# Patient Record
Sex: Male | Born: 1995 | Race: White | Hispanic: No | Marital: Single | Smoking: Current every day smoker
Health system: Southern US, Community
[De-identification: ages and names within clinical notes are randomized; demographics above are authoritative.]

## PROBLEM LIST (undated history)

## (undated) DIAGNOSIS — F329 Major depressive disorder, single episode, unspecified: Secondary | ICD-10-CM

## (undated) DIAGNOSIS — F32A Depression, unspecified: Secondary | ICD-10-CM

---
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
## 2014-09-10 ENCOUNTER — Emergency Department (HOSPITAL_COMMUNITY)
Admission: EM | Admit: 2014-09-10 | Discharge: 2014-09-11 | Disposition: A | Discharge status: Other Acute Inpatient Hospital | Payer: Medicaid Other | Attending: Emergency Medicine | Admitting: Emergency Medicine

## 2014-09-10 ENCOUNTER — Encounter (HOSPITAL_COMMUNITY): Payer: Self-pay | Admitting: Emergency Medicine

## 2014-09-10 DIAGNOSIS — R440 Auditory hallucinations: Secondary | ICD-10-CM | POA: Diagnosis not present

## 2014-09-10 LAB — CBC WITH DIFFERENTIAL/PLATELET
Basophils Absolute: 0 10*3/uL (ref 0.0–0.1)
Basophils Relative: 0 % (ref 0–1)
EOS ABS: 0.4 10*3/uL (ref 0.0–0.7)
EOS PCT: 3 % (ref 0–5)
HCT: 43.7 % (ref 39.0–52.0)
Hemoglobin: 14.8 g/dL (ref 13.0–17.0)
LYMPHS ABS: 2.9 10*3/uL (ref 0.7–4.0)
Lymphocytes Relative: 25 % (ref 12–46)
MCH: 28.6 pg (ref 26.0–34.0)
MCHC: 33.9 g/dL (ref 30.0–36.0)
MCV: 84.5 fL (ref 78.0–100.0)
MONOS PCT: 5 % (ref 3–12)
Monocytes Absolute: 0.6 10*3/uL (ref 0.1–1.0)
Neutro Abs: 7.7 10*3/uL (ref 1.7–7.7)
Neutrophils Relative %: 67 % (ref 43–77)
PLATELETS: 300 10*3/uL (ref 150–400)
RBC: 5.17 MIL/uL (ref 4.22–5.81)
RDW: 12.7 % (ref 11.5–15.5)
WBC: 11.6 10*3/uL — ABNORMAL HIGH (ref 4.0–10.5)

## 2014-09-10 LAB — COMPREHENSIVE METABOLIC PANEL
ALT: 35 U/L (ref 17–63)
ANION GAP: 10 (ref 5–15)
AST: 39 U/L (ref 15–41)
Albumin: 5.2 g/dL — ABNORMAL HIGH (ref 3.5–5.0)
Alkaline Phosphatase: 88 U/L (ref 38–126)
BUN: 12 mg/dL (ref 6–20)
CALCIUM: 10 mg/dL (ref 8.9–10.3)
CHLORIDE: 105 mmol/L (ref 101–111)
CO2: 26 mmol/L (ref 22–32)
Creatinine, Ser: 0.81 mg/dL (ref 0.61–1.24)
GFR calc non Af Amer: >60 mL/min (ref >60)
Glucose, Bld: 102 mg/dL — ABNORMAL HIGH (ref 65–99)
Potassium: 4.4 mmol/L (ref 3.5–5.1)
SODIUM: 141 mmol/L (ref 135–145)
Total Bilirubin: 0.6 mg/dL (ref 0.3–1.2)
Total Protein: 8.7 g/dL — ABNORMAL HIGH (ref 6.5–8.1)

## 2014-09-10 LAB — ETHANOL: Alcohol, Ethyl (B): <5 mg/dL (ref <5)

## 2014-09-10 MED ORDER — ACETAMINOPHEN 325 MG PO TABS: 650.0000 mg | ORAL_TABLET | ORAL | Status: DC | PRN

## 2014-09-10 MED ORDER — IBUPROFEN 200 MG PO TABS: 600.0000 mg | ORAL_TABLET | Freq: Three times a day (TID) | ORAL | Status: DC | PRN

## 2014-09-10 MED ORDER — ZOLPIDEM TARTRATE 5 MG PO TABS
5.0000 mg | ORAL_TABLET | Freq: Every evening | ORAL | Status: DC | PRN
  Administered 2014-09-10: 5 mg via ORAL
  Filled 2014-09-10: qty 1

## 2014-09-10 MED ORDER — ONDANSETRON HCL 4 MG PO TABS: 4.0000 mg | ORAL_TABLET | Freq: Three times a day (TID) | ORAL | Status: DC | PRN

## 2014-09-10 MED ORDER — ALUM & MAG HYDROXIDE-SIMETH 200-200-20 MG/5ML PO SUSP: 30.0000 mL | ORAL | Status: DC | PRN

## 2014-09-10 MED ORDER — LORAZEPAM 1 MG PO TABS: 1.0000 mg | ORAL_TABLET | Freq: Three times a day (TID) | ORAL | Status: DC | PRN

## 2014-09-10 NOTE — ED Provider Notes (Signed)
CSN: [id number]     Arrival date & time 09/10/14  1807 History   First MD Initiated Contact with Patient 09/10/14 1821     Chief Complaint  Patient presents with  . auditory hallucinations      (Consider location/radiation/quality/duration/timing/severity/associated sxs/prior Treatment) HPI Comments: Patient presents to the emergency department for evaluation of auditory hallucinations. Patient reports that he has been hearing voices for the last 4 years. He is not currently on any medications. Patient reports that the voices to command him to hurt himself and others. Patient reports that these have increasingly worsened recently. He has had dreams where he kills people and then killed himself, is concerned that he is going to act on these hallucinations.   History reviewed. No pertinent past medical history. History reviewed. No pertinent past surgical history. No family history on file. Social History  Substance Use Topics  . Smoking status: Never Smoker   . Smokeless tobacco: None  . Alcohol Use: No    Review of Systems  Psychiatric/Behavioral: Positive for hallucinations.  All other systems reviewed and are negative.     Allergies  Review of patient's allergies indicates no known allergies.  Home Medications   Prior to Admission medications   Not on File   There were no vitals taken for this visit. Physical Exam  Constitutional: He is oriented to person, place, and time. He appears well-developed and well-nourished. No distress.  HENT:  Head: Normocephalic and atraumatic.  Right Ear: Hearing normal.  Left Ear: Hearing normal.  Nose: Nose normal.  Mouth/Throat: Oropharynx is clear and moist and mucous membranes are normal.  Eyes: Conjunctivae and EOM are normal. Pupils are equal, round, and reactive to light.  Neck: Normal range of motion. Neck supple.  Cardiovascular: Regular rhythm, S1 normal and S2 normal.  Exam reveals no gallop and no friction rub.   No  murmur heard. Pulmonary/Chest: Effort normal and breath sounds normal. No respiratory distress. He exhibits no tenderness.  Abdominal: Soft. Normal appearance and bowel sounds are normal. There is no hepatosplenomegaly. There is no tenderness. There is no rebound, no guarding, no tenderness at McBurney's point and negative Murphy's sign. No hernia.  Musculoskeletal: Normal range of motion.  Neurological: He is alert and oriented to person, place, and time. He has normal strength. No cranial nerve deficit or sensory deficit. Coordination normal. GCS eye subscore is 4. GCS verbal subscore is 5. GCS motor subscore is 6.  Skin: Skin is warm, dry and intact. No rash noted. No cyanosis.  Psychiatric: His behavior is normal. His speech is delayed. He exhibits a depressed mood.  Nursing note and vitals reviewed.   ED Course  Procedures (including critical care time) Labs Review Labs Reviewed  CBC WITH DIFFERENTIAL/PLATELET  COMPREHENSIVE METABOLIC PANEL  URINE RAPID DRUG SCREEN, HOSP PERFORMED  ETHANOL  URINALYSIS, ROUTINE W REFLEX MICROSCOPIC (NOT AT [facility])    Imaging Review No results found. I have personally reviewed and evaluated these images and lab results as part of my medical decision-making.   EKG Interpretation None      MDM   Final diagnoses:  None   auditory hallucinations  Patient presents for evaluation of auditory hallucinations that command him to kill others and kill himself. He reports that the voices are becoming more prevalent and he is concerned that he is going to act on them. He does not have a plan for homicidal or suicidal. He will require psychiatric evaluation.    Gilda Crease, MD 09/10/14  1837 

## 2014-09-10 NOTE — BH Assessment (Addendum)
Tele Assessment Note   Ronald Gray is an 19 y.o. male presenting to [facility] reporting command auditory hallucinations. Pt stated "for the past 4 years I have been hearing voices". "Sometimes it's a whisper and other times it's like a regular conversation telling mem to hurt myself and others". "I have dreams where I hurt myself and others". Pt is endorsing SI and HI but denies having an active plan. Pt is also reporting auditory hallucinations and stated "I hear the voices saying I'm nothing, I'm worthless and I should take my pain out on others".  Pt reported that he always has thoughts of hurting himself due to the voices telling him to harm himself. Pt did not report any previous suicide attempts or psychiatric hospitalizations. Pt is currently not receiving any mental health treatment. PT denies engaging in any self-injurious behaviors. Pt is endorsing multiple depressive symptoms and shared that finding a job is stressful. Pt reported that there are times he has trouble falling asleep but shared that he gets at least 7-8 hours of sleep. Pt did not report any issues with his appetite. Pt denies having access to weapons or firearms at this time. PT did not report any pending criminal charges or upcoming court dates. PT reported that he smoke marijuana; however he did not provide any additional details regarding his use. Pt did not report any physical, sexual or emotional abuse at this time.  Inpatient treatment is recommended for psychiatric stabilization.   Axis I: Psychotic Disorder NOS  Past Medical History: History reviewed. No pertinent past medical history.  History reviewed. No pertinent past surgical history.  Family History: No family history on file.  Social History:  reports that he has never smoked. He does not have any smokeless tobacco history on file. He reports that he does not drink alcohol or use illicit drugs.  Additional Social History:  Alcohol / Drug Use History of  alcohol / drug use?: No history of alcohol / drug abuse  CIWA:   COWS:    PATIENT STRENGTHS: (choose at least two) Average or above average intelligence Motivation for treatment/growth  Allergies:  Allergies  Allergen Reactions  . Latex Rash    Home Medications:  (Not in a hospital admission)  OB/GYN Status:  No LMP for male patient.  General Assessment Data Location of Assessment: [facility] ED TTS Assessment: In system Is this a Tele or Face-to-Face Assessment?: Face-to-Face Is this an Initial Assessment or a Re-assessment for this encounter?: Initial Assessment Marital status: Single Living Arrangements: Parent, Other relatives Can pt return to current living arrangement?: Yes Admission Status: Voluntary Is patient capable of signing voluntary admission?: Yes Referral Source: Self/Family/Friend Insurance type: Medicaid      Crisis Care Plan Living Arrangements: Parent, Other relatives Name of Psychiatrist: No provider reported at this time.  Name of Therapist: No provider reported at this time.   Education Status Is patient currently in school?: No Current Grade: N/A Highest grade of school patient has completed: N/A Name of school: N/A Contact person: N/A  Risk to self with the past 6 months Suicidal Ideation: Yes-Currently Present Has patient been a risk to self within the past 6 months prior to admission? : Yes Suicidal Intent: No Has patient had any suicidal intent within the past 6 months prior to admission? : No Is patient at risk for suicide?: Yes Suicidal Plan?: No Has patient had any suicidal plan within the past 6 months prior to admission? : No Access to Means: No What  has been your use of drugs/alcohol within the last 12 months?: THC use reported; however pt did not provide any additional details. Previous Attempts/Gestures: No How many times?: 0 Other Self Harm Risks: No other self harm risk identified at this time.  Triggers for Past Attempts: None  known Intentional Self Injurious Behavior: None Family Suicide History: No Recent stressful life event(s): Other (Comment) (unemployment ) Persecutory voices/beliefs?: Yes Depression: Yes Depression Symptoms: Despondent, Insomnia, Isolating, Feeling worthless/self pity, Feeling angry/irritable Substance abuse history and/or treatment for substance abuse?: Yes Suicide prevention information given to non-admitted patients: Not applicable  Risk to Others within the past 6 months Homicidal Ideation: Yes-Currently Present Does patient have any lifetime risk of violence toward others beyond the six months prior to admission? : No Thoughts of Harm to Others: Yes-Currently Present Comment - Thoughts of Harm to Others: "I hear voices telling me to harm others" Current Homicidal Intent: No Access to Homicidal Means: No Identified Victim: N/A History of harm to others?: No Assessment of Violence: On admission Violent Behavior Description: No violent behaviors observed. Pt is calm and cooperative at this time.  Does patient have access to weapons?: No Criminal Charges Pending?: No Does patient have a court date: No Is patient on probation?: No  Psychosis Hallucinations: Auditory, With command Delusions: None noted  Mental Status Report Appearance/Hygiene: Unremarkable Eye Contact: Good Motor Activity: Freedom of movement Speech: Logical/coherent, Soft Level of Consciousness: Alert Mood: Euthymic, Pleasant Affect: Appropriate to circumstance Anxiety Level: Minimal Thought Processes: Coherent, Relevant Judgement: Partial Orientation: Person, Time, Situation, Place Obsessive Compulsive Thoughts/Behaviors: None  Cognitive Functioning Concentration: Fair Memory: Recent Intact, Remote Intact IQ: Average Insight: Fair Impulse Control: Fair Appetite: Good Weight Loss: 0 Weight Gain: 0 Sleep: No Change Total Hours of Sleep: 8 Vegetative Symptoms: Staying in bed ("Lounging around"  )  ADLScreening [facility] Assessment Services) Patient's cognitive ability adequate to safely complete daily activities?: Yes Patient able to express need for assistance with ADLs?: Yes Independently performs ADLs?: Yes (appropriate for developmental age)  Prior Inpatient Therapy Prior Inpatient Therapy: No Prior Therapy Dates: N/A Prior Therapy Facilty/Provider(s): N/A Reason for Treatment: N/A  Prior Outpatient Therapy Prior Outpatient Therapy: No Prior Therapy Dates: N/A Prior Therapy Facilty/Provider(s): N/A Reason for Treatment: N/A Does patient have an ACCT team?: No Does patient have Intensive In-House Services?  : No Does patient have [facility] services? : No Does patient have P4CC services?: No  ADL Screening (condition at time of admission) Patient's cognitive ability adequate to safely complete daily activities?: Yes Is the patient deaf or have difficulty hearing?: No Does the patient have difficulty seeing, even when wearing glasses/contacts?: No Does the patient have difficulty concentrating, remembering, or making decisions?: No Patient able to express need for assistance with ADLs?: Yes Does the patient have difficulty dressing or bathing?: No Independently performs ADLs?: Yes (appropriate for developmental age)       Abuse/Neglect Assessment (Assessment to be complete while patient is alone) Physical Abuse: Denies Verbal Abuse: Denies Sexual Abuse: Denies Exploitation of patient/patient's resources: Denies Self-Neglect: Denies     Merchant navy officer (For Healthcare) Does patient have an advance directive?: No Would patient like information on creating an advanced directive?: No - patient declined information    Additional Information 1:1 In Past 12 Months?: No CIRT Risk: No Elopement Risk: No Does patient have medical clearance?: Yes     Disposition:  Disposition Initial Assessment Completed for this Encounter: Yes  Breunna Nordmann S 09/10/2014 8:12  PM

## 2014-09-10 NOTE — BH Assessment (Signed)
Pt has been accepted [facility] Room 505 Bed 2 to the services of Dr. Elna Breslow. Dr. Blinda Leatherwood has been informed of the recommendation. Support paperwork completed and faxed over.

## 2014-09-10 NOTE — ED Notes (Signed)
Per pt, states he has being hearing voices for 4 years-states they have been getting increasingly worse-unable to concentrate-telling him to hurt self

## 2014-09-10 NOTE — BH Assessment (Signed)
Assessment completed. Consulted Hulan Fess, NP who recommend inpatient treatment. Dr. Blinda Leatherwood has been informed of the recommendation. TTS will seek placement.

## 2014-09-11 ENCOUNTER — Inpatient Hospital Stay (HOSPITAL_COMMUNITY)
Admission: AD | Admit: 2014-09-11 | Discharge: 2014-09-14 | DRG: 885 | Disposition: A | Discharge status: Home / Self Care | Payer: Medicaid Other | Source: Intra-hospital | Attending: Psychiatry | Admitting: Psychiatry

## 2014-09-11 ENCOUNTER — Encounter (HOSPITAL_COMMUNITY): Payer: Self-pay

## 2014-09-11 DIAGNOSIS — F419 Anxiety disorder, unspecified: Secondary | ICD-10-CM | POA: Diagnosis present

## 2014-09-11 DIAGNOSIS — G4710 Hypersomnia, unspecified: Secondary | ICD-10-CM | POA: Diagnosis present

## 2014-09-11 DIAGNOSIS — R45851 Suicidal ideations: Secondary | ICD-10-CM | POA: Diagnosis present

## 2014-09-11 DIAGNOSIS — F1220 Cannabis dependence, uncomplicated: Secondary | ICD-10-CM | POA: Diagnosis present

## 2014-09-11 DIAGNOSIS — F323 Major depressive disorder, single episode, severe with psychotic features: Secondary | ICD-10-CM | POA: Diagnosis present

## 2014-09-11 DIAGNOSIS — F29 Unspecified psychosis not due to a substance or known physiological condition: Secondary | ICD-10-CM | POA: Diagnosis present

## 2014-09-11 HISTORY — DX: Major depressive disorder, single episode, unspecified: F32.9

## 2014-09-11 HISTORY — DX: Depression, unspecified: F32.A

## 2014-09-11 MED ORDER — ACETAMINOPHEN 325 MG PO TABS
650.0000 mg | ORAL_TABLET | Freq: Four times a day (QID) | ORAL | Status: DC | PRN
  Administered 2014-09-11 – 2014-09-14 (×2): 650 mg via ORAL
  Filled 2014-09-11 (×2): qty 2

## 2014-09-11 MED ORDER — HYDROXYZINE HCL 25 MG PO TABS: 25.0000 mg | ORAL_TABLET | Freq: Four times a day (QID) | ORAL | Status: DC | PRN

## 2014-09-11 MED ORDER — ALUM & MAG HYDROXIDE-SIMETH 200-200-20 MG/5ML PO SUSP: 30.0000 mL | ORAL | Status: DC | PRN

## 2014-09-11 MED ORDER — MAGNESIUM HYDROXIDE 400 MG/5ML PO SUSP: 30.0000 mL | Freq: Every day | ORAL | Status: DC | PRN

## 2014-09-11 MED ORDER — TRAZODONE HCL 50 MG PO TABS: 50.0000 mg | ORAL_TABLET | Freq: Every evening | ORAL | Status: DC | PRN

## 2014-09-11 MED ORDER — ARIPIPRAZOLE 2 MG PO TABS
2.0000 mg | ORAL_TABLET | Freq: Every day | ORAL | Status: DC
  Administered 2014-09-11 – 2014-09-14 (×4): 2 mg via ORAL
  Filled 2014-09-11 (×7): qty 1

## 2014-09-11 MED ORDER — OLANZAPINE 5 MG PO TBDP: 5.0000 mg | ORAL_TABLET | Freq: Three times a day (TID) | ORAL | Status: DC | PRN

## 2014-09-11 MED ORDER — INFLUENZA VAC SPLIT QUAD 0.5 ML IM SUSY
0.5000 mL | PREFILLED_SYRINGE | INTRAMUSCULAR | Status: AC
  Administered 2014-09-12: 0.5 mL via INTRAMUSCULAR
  Filled 2014-09-11: qty 0.5

## 2014-09-11 NOTE — Progress Notes (Signed)
[facility] Group Notes:  (Nursing/MHT/Case Management/Adjunct)  Date:  09/11/2014  Time:  11:10 PM  Type of Therapy:  Psychoeducational Skills  Participation Level:  Active  Participation Quality:  Appropriate  Affect:  Appropriate  Cognitive:  Appropriate  Insight:  Good  Engagement in Group:  Developing/Improving  Modes of Intervention:  Education  Summary of Progress/Problems: The patient shared with the group that he had a good day since he was able to play basketball and take a few naps. As a theme for the day, his support system will be comprised of his mother.   Abrea Henle S 09/11/2014, 11:10 PM

## 2014-09-11 NOTE — [facility] Group Notes (Signed)
[facility] Group Notes:  (Clinical Social Work)  09/11/2014  11:15-12:00PM  Summary of Progress/Problems:   The main focus of today's process group was to discuss patients' feelings related to being hospitalized, as well as the difference between "being" and "having" a mental health diagnosis.  It was agreed in general by the group that it would be preferable to avoid future hospitalizations, and we discussed means of doing that.  As a follow-up, problems with adhering to medication recommendations were discussed.  The patient expressed his primary feeling about being hospitalized is "unknown" and was only able to say he feels different in the hospital than out of it, was not able to elaborate.  He did not speak for the entire remainder of group.  Type of Therapy:  Group Therapy - Process  Participation Level:  Active  Participation Quality:  Attentive  Affect:  Flat  Cognitive:  Alert  Insight:  Limited  Engagement in Therapy:  Limited  Modes of Intervention:  Exploration, Discussion  Ronald Mantle, LCSW 09/11/2014, 12:50 PM

## 2014-09-11 NOTE — [facility] Suicide Risk Assessment (Signed)
[facility] INPATIENT:  Family/Significant Other Suicide Prevention Education  Suicide Prevention Education:  Patient Refusal for Family/Significant Other Suicide Prevention Education: The patient Ronald Gray has refused to provide written consent for family/significant other to be provided Family/Significant Other Suicide Prevention Education during admission and/or prior to discharge.  Physician notified.  Patient denies having access to weapons.  Sarina Ser 09/11/2014, 2:06 PM

## 2014-09-11 NOTE — Progress Notes (Signed)
D: Pt has anxious affect and pleasant mood.  He reports he is "doing good."  Pt reports that today he "got up and went to the 2 group sessions, worked on a puzzle, watched TV, went to the gym and I played some basketball."  Pt denies SI/HI, denies hallucinations.  He reports pain from a headache of 5/10.  Pt has been visible in milieu interacting with peers and staff appropriately.  He attended evening group.   A: Introduced self to pt.  Met with pt 1:1 and provided support and encouragement.  Actively listened to pt.  PRN medication administered for pain. R: Pt is compliant with medications.  Pt verbally contracts for safety.  Will continue to monitor and assess.

## 2014-09-11 NOTE — Progress Notes (Signed)
Adult Psychoeducational Group Note  Date:  09/11/2014 Time: 09:15am  Group Topic/Focus:  Healthy Communication:   The focus of this group is to discuss communication, barriers to communication, as well as healthy ways to communicate with others.  Participation Level:  Active  Participation Quality:  Appropriate, Attentive and Sharing  Affect:  Flat  Cognitive:  Alert, Appropriate and Oriented  Insight: Good  Engagement in Group:  Engaged  Modes of Intervention:  Activity, Discussion, Education, Orientation and Socialization  Additional Comments:  Pt actively participated in group. Pt declined to share personal healthy communication technique but did share biggest fears (death and not being loved) during activity.   Aurora Mask 09/11/2014, 10:58 AM

## 2014-09-11 NOTE — Progress Notes (Signed)
Patient ID: Ronald Gray, male   DOB: 07-28-1995, 19 y.o.   MRN: [id number]   D: Pt has been very flat and depressed on the unit today. He has also been very isolative, and has remained in his room most of the day. Pt reported that he was fine, and had no issues. Pt was very guarded and did not share much with this Clinical research associate. Pt had no scheduled medications, and did not request any additional medications. Pt reported being negative SI/HI, no AH/VH noted. A: 15 min checks continued for patient safety. R: Pt safety maintained.

## 2014-09-11 NOTE — H&P (Signed)
Psychiatric Admission Assessment Adult  Patient Identification: Ronald Gray MRN:  [id number] Date of Evaluation:  09/11/2014 Chief Complaint:  "The voices have been getting worse lately."  Principal Diagnosis: Severe major depression with psychotic features, mood-congruent Diagnosis:   Patient Active Problem List   Diagnosis Date Noted  . 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 Severe major depression with psychotic features, mood-congruent [F32.3] 09/11/2014   History of Present Illness::   Ronald Gray is an 19 y.o. male presenting to [facility] reporting command auditory hallucinations. Patient stated "for the past four years I have been hearing voices". "Sometimes it's a whisper and other times it's like a regular conversation telling me to hurt myself and others". "I have dreams where I hurt myself and others". Patient is endorsing SI and HI but denies having an active plan. Patient is also reporting auditory hallucinations and stated "I hear the voices saying I'm nothing, I'm worthless and I should take my pain out on others". Patient reported that he always has thoughts of hurting himself due to the voices telling him to harm himself. Patient did not report any previous suicide attempts or psychiatric hospitalizations. Patient is currently not receiving any mental health treatment. Patient denies engaging in any self-injurious behaviors. Patient is endorsing multiple depressive symptoms and shared that finding a job is stressful. Patient reported that there are times he has trouble falling asleep but shared that he gets at least 7-8 hours of sleep. Patient did not report any issues with his appetite. Patient did not report any pending criminal charges or upcoming court dates. Patient reported that he smokes marijuana; however he did not provide any additional details regarding his use. Patient did not report any physical, sexual or emotional abuse. He denies past manic  episodes, paranoia, delusions, visual hallucinations. He endorses that his main problems are with depression and psychosis. Patient reports "isolating in my room at least once every week. At times he reports not sleeping very much and then too much. He denies every intentionally hurting himself in the past. The patient has a urine drug screen that is pending.   Elements:  Location:  psychosis. Quality:  auditory hallucinations, depressive symptoms. Severity:  Severe. Timing:  Last few weeks. Duration:  "Started four years ago." . Context:  depressive symptoms, command hallucinations . Associated Signs/Symptoms: Depression Symptoms:  depressed mood, anhedonia, hypersomnia, psychomotor retardation, feelings of worthlessness/guilt, difficulty concentrating, hopelessness, recurrent thoughts of death, anxiety, loss of energy/fatigue, (Hypo) Manic Symptoms:  Denies Anxiety Symptoms:  Denies Psychotic Symptoms:  Hallucinations: Auditory PTSD Symptoms: Negative Total Time spent with patient: 45 minutes  Past Medical History: History reviewed. No pertinent past medical history. History reviewed. No pertinent past surgical history. Family History: History reviewed. No pertinent family history. Social History:  History  Alcohol Use No     History  Drug Use No    Social History   Social History  . Marital Status: Single    Spouse Name: N/A  . Number of Children: N/A  . Years of Education: N/A   Social History Main Topics  . Smoking status: Never Smoker   . Smokeless tobacco: None  . Alcohol Use: No  . Drug Use: No  . Sexual Activity: Not Asked   Other Topics Concern  . None   Social History Narrative   Additional Social History:    Pain Medications: none Prescriptions: none History of alcohol / drug use?: Yes Name of Substance 1: THC  1 - Age of First Use: unknown  1 - Amount (size/oz): unknown  1 - Frequency: unknown  1 - Duration: unknown  1 - Last Use / Amount:  1 week ago                    Musculoskeletal: Strength & Muscle Tone: within normal limits Gait & Station: normal Patient leans: N/A  Psychiatric Specialty Exam: Physical Exam  Constitutional:  Physical exam findings reviewed from the [facility] and I concur with no noted exceptions.   Psychiatric: Judgment normal. His speech is delayed. He is slowed. Cognition and memory are normal. He exhibits a depressed mood. He expresses suicidal ideation.    Review of Systems  Constitutional: Negative.   HENT: Negative.   Eyes: Negative.   Respiratory: Negative.   Cardiovascular: Negative.   Gastrointestinal: Negative.   Genitourinary: Negative.   Musculoskeletal: Negative.   Skin: Negative.   Neurological: Negative.   Endo/Heme/Allergies: Negative.   Psychiatric/Behavioral: Positive for depression, suicidal ideas and hallucinations. The patient is nervous/anxious and has insomnia.     Blood pressure 124/74, pulse 88, temperature 98.7 F (37.1 C), temperature source Oral, resp. rate 18, height 6' 0.5" (1.842 m), weight 127.461 kg (281 lb).Body mass index is 37.57 kg/(m^2).  General Appearance: Casual  Eye Contact::  Good  Speech:  Clear and Coherent  Volume:  Normal  Mood:  Anxious  Affect:  Congruent  Thought Process:  Coherent  Orientation:  Full (Time, Place, and Person)  Thought Content:  Hallucinations: Auditory  Suicidal Thoughts:  No  Homicidal Thoughts:  No  Memory:  Immediate;   Good Recent;   Fair Remote;   Fair  Judgement:  Fair  Insight:  Shallow  Psychomotor Activity:  Normal  Concentration:  Fair  Recall:  Good  Fund of Knowledge:Fair  Language: Good  Akathisia:  No  Handed:  Right  AIMS (if indicated):     Assets:  Communication Skills Desire for Improvement Housing Intimacy Leisure Time Physical Health Resilience Social Support  ADL's:  Intact  Cognition: WNL  Sleep:  Number of Hours: 2.5   Risk to Self: Is patient at risk for suicide?:  Yes What has been your use of drugs/alcohol within the last 12 months?: Uses marijuana occasionally Risk to Others:   Prior Inpatient Therapy:   Prior Outpatient Therapy:    Alcohol Screening: 1. How often do you have a drink containing alcohol?: Never 9. Have you or someone else been injured as a result of your drinking?: No 10. Has a relative or friend or a doctor or another health worker been concerned about your drinking or suggested you cut down?: No Alcohol Use Disorder Identification Test Final Score (AUDIT): 0 Brief Intervention: AUDIT score less than 7 or less-screening does not suggest unhealthy drinking-brief intervention not indicated  Allergies:   Allergies  Allergen Reactions  . Latex Rash   Lab Results:  Results for orders placed or performed during the hospital encounter of 09/10/14 (from the past 48 hour(s))  CBC WITH DIFFERENTIAL     Status: Abnormal   Collection Time: 09/10/14  6:27 PM  Result Value Ref Range   WBC 11.6 (H) 4.0 - 10.5 K/uL   RBC 5.17 4.22 - 5.81 MIL/uL   Hemoglobin 14.8 13.0 - 17.0 g/dL   HCT 27.9 35.9 - 60.2 %   MCV 84.5 78.0 - 100.0 fL   MCH 28.6 26.0 - 34.0 pg   MCHC 33.9 30.0 - 36.0 g/dL   RDW 52.6 63.2 - 77.1 %  Platelets 300 150 - 400 K/uL   Neutrophils Relative % 67 43 - 77 %   Neutro Abs 7.7 1.7 - 7.7 K/uL   Lymphocytes Relative 25 12 - 46 %   Lymphs Abs 2.9 0.7 - 4.0 K/uL   Monocytes Relative 5 3 - 12 %   Monocytes Absolute 0.6 0.1 - 1.0 K/uL   Eosinophils Relative 3 0 - 5 %   Eosinophils Absolute 0.4 0.0 - 0.7 K/uL   Basophils Relative 0 0 - 1 %   Basophils Absolute 0.0 0.0 - 0.1 K/uL  Comprehensive metabolic panel     Status: Abnormal   Collection Time: 09/10/14  6:27 PM  Result Value Ref Range   Sodium 141 135 - 145 mmol/L   Potassium 4.4 3.5 - 5.1 mmol/L   Chloride 105 101 - 111 mmol/L   CO2 26 22 - 32 mmol/L   Glucose, Bld 102 (H) 65 - 99 mg/dL   BUN 12 6 - 20 mg/dL   Creatinine, Ser 0.81 0.61 - 1.24 mg/dL   Calcium  10.0 8.9 - 10.3 mg/dL   Total Protein 8.7 (H) 6.5 - 8.1 g/dL   Albumin 5.2 (H) 3.5 - 5.0 g/dL   AST 39 15 - 41 U/L   ALT 35 17 - 63 U/L   Alkaline Phosphatase 88 38 - 126 U/L   Total Bilirubin 0.6 0.3 - 1.2 mg/dL   GFR calc non Af Amer >60 >60 mL/min   GFR calc Af Amer >60 >60 mL/min    Comment: (NOTE) The eGFR has been calculated using the CKD EPI equation. This calculation has not been validated in all clinical situations. eGFR's persistently <60 mL/min signify possible Chronic Kidney Disease.    Anion gap 10 5 - 15  Ethanol     Status: None   Collection Time: 09/10/14  6:27 PM  Result Value Ref Range   Alcohol, Ethyl (B) <5 <5 mg/dL    Comment:        LOWEST DETECTABLE LIMIT FOR SERUM ALCOHOL IS 5 mg/dL FOR MEDICAL PURPOSES ONLY    Current Medications: Current Facility-Administered Medications  Medication Dose Route Frequency Provider Last Rate Last Dose  . acetaminophen (TYLENOL) tablet 650 mg  650 mg Oral Q6H PRN Harriet Butte, NP      . alum & mag hydroxide-simeth (MAALOX/MYLANTA) 200-200-20 MG/5ML suspension 30 mL  30 mL Oral Q4H PRN Harriet Butte, NP      . ARIPiprazole (ABILIFY) tablet 2 mg  2 mg Oral Daily Niel Hummer, NP      . hydrOXYzine (ATARAX/VISTARIL) tablet 25 mg  25 mg Oral Q6H PRN Niel Hummer, NP      . Derrill Memo ON 09/12/2014] Influenza vac split quadrivalent PF (FLUARIX) injection 0.5 mL  0.5 mL Intramuscular Tomorrow-1000 Saramma Eappen, MD      . magnesium hydroxide (MILK OF MAGNESIA) suspension 30 mL  30 mL Oral Daily PRN Harriet Butte, NP      . OLANZapine zydis (ZYPREXA) disintegrating tablet 5 mg  5 mg Oral Q8H PRN Niel Hummer, NP      . traZODone (DESYREL) tablet 50 mg  50 mg Oral QHS PRN Niel Hummer, NP       PTA Medications: No prescriptions prior to admission    Previous Psychotropic Medications: No   Substance Abuse History in the last 12 months:  No.  Consequences of Substance Abuse: Negative  Results for orders placed or  performed during the  hospital encounter of 09/10/14 (from the past 72 hour(s))  CBC WITH DIFFERENTIAL     Status: Abnormal   Collection Time: 09/10/14  6:27 PM  Result Value Ref Range   WBC 11.6 (H) 4.0 - 10.5 K/uL   RBC 5.17 4.22 - 5.81 MIL/uL   Hemoglobin 14.8 13.0 - 17.0 g/dL   HCT 43.7 39.0 - 52.0 %   MCV 84.5 78.0 - 100.0 fL   MCH 28.6 26.0 - 34.0 pg   MCHC 33.9 30.0 - 36.0 g/dL   RDW 12.7 11.5 - 15.5 %   Platelets 300 150 - 400 K/uL   Neutrophils Relative % 67 43 - 77 %   Neutro Abs 7.7 1.7 - 7.7 K/uL   Lymphocytes Relative 25 12 - 46 %   Lymphs Abs 2.9 0.7 - 4.0 K/uL   Monocytes Relative 5 3 - 12 %   Monocytes Absolute 0.6 0.1 - 1.0 K/uL   Eosinophils Relative 3 0 - 5 %   Eosinophils Absolute 0.4 0.0 - 0.7 K/uL   Basophils Relative 0 0 - 1 %   Basophils Absolute 0.0 0.0 - 0.1 K/uL  Comprehensive metabolic panel     Status: Abnormal   Collection Time: 09/10/14  6:27 PM  Result Value Ref Range   Sodium 141 135 - 145 mmol/L   Potassium 4.4 3.5 - 5.1 mmol/L   Chloride 105 101 - 111 mmol/L   CO2 26 22 - 32 mmol/L   Glucose, Bld 102 (H) 65 - 99 mg/dL   BUN 12 6 - 20 mg/dL   Creatinine, Ser 0.81 0.61 - 1.24 mg/dL   Calcium 10.0 8.9 - 10.3 mg/dL   Total Protein 8.7 (H) 6.5 - 8.1 g/dL   Albumin 5.2 (H) 3.5 - 5.0 g/dL   AST 39 15 - 41 U/L   ALT 35 17 - 63 U/L   Alkaline Phosphatase 88 38 - 126 U/L   Total Bilirubin 0.6 0.3 - 1.2 mg/dL   GFR calc non Af Amer >60 >60 mL/min   GFR calc Af Amer >60 >60 mL/min    Comment: (NOTE) The eGFR has been calculated using the CKD EPI equation. This calculation has not been validated in all clinical situations. eGFR's persistently <60 mL/min signify possible Chronic Kidney Disease.    Anion gap 10 5 - 15  Ethanol     Status: None   Collection Time: 09/10/14  6:27 PM  Result Value Ref Range   Alcohol, Ethyl (B) <5 <5 mg/dL    Comment:        LOWEST DETECTABLE LIMIT FOR SERUM ALCOHOL IS 5 mg/dL FOR MEDICAL PURPOSES ONLY      Observation Level/Precautions:  15 minute checks  Laboratory:  CBC Chemistry Profile  Psychotherapy:  Individual and Group Therapy  Medications:  Start Abilify 2 mg daily for psychotic symptoms, Consider starting an antidepressant upon further evaluation   Consultations:  As needed  Discharge Concerns:  Safety and Stability   Estimated LOS: 2-5 days  Other:  Increase collateral information from family    Psychological Evaluations: Yes   Treatment Plan Summary: Daily contact with patient to assess and evaluate symptoms and progress in treatment and Medication management  Medical Decision Making:  New problem, with additional work up planned, Review of Psycho-Social Stressors (1), Review or order clinical lab tests (1), Order AIMS Test (2) and Review of Medication Regimen & Side Effects (2)  I certify that inpatient services furnished can reasonably be expected to improve  the patient's condition.   Elmarie Shiley, NP-C 8/27/20165:53 PM I personally assessed the patient, reviewed the physical exam and labs and formulated the treatment plan Geralyn Flash A. Sabra Heck, M.D.

## 2014-09-11 NOTE — BHH Suicide Risk Assessment (Signed)
[facility] Admission Suicide Risk Assessment   Nursing information obtained from:    Demographic factors:    Current Mental Status:    Loss Factors:    Historical Factors:    Risk Reduction Factors:    Total Time spent with patient: 45 minutes Principal Problem: Severe major depression with psychotic features, mood-congruent Diagnosis:   Patient Active Problem List   Diagnosis Date Noted  . 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 Severe major depression with psychotic features, mood-congruent [F32.3] 09/11/2014     Continued Clinical Symptoms:  Alcohol Use Disorder Identification Test Final Score (AUDIT): 0 The "Alcohol Use Disorders Identification Test", Guidelines for Use in Primary Care, Second Edition.  World Science writer [facility]). Score between 0-7:  no or low risk or alcohol related problems. Score between 8-15:  moderate risk of alcohol related problems. Score between 16-19:  high risk of alcohol related problems. Score 20 or above:  warrants further diagnostic evaluation for alcohol dependence and treatment.   CLINICAL FACTORS:   Depression:   Insomnia Hallucinations   Musculoskeletal: Strength & Muscle Tone: within normal limits Gait & Station: normal Patient leans: normal  Psychiatric Specialty Exam: Physical Exam  Review of Systems  Constitutional: Negative.   Eyes: Positive for blurred vision.  Respiratory: Negative.   Cardiovascular: Positive for palpitations.  Gastrointestinal: Negative.   Genitourinary: Negative.   Musculoskeletal: Positive for back pain.  Skin: Negative.   Neurological: Positive for headaches.  Endo/Heme/Allergies: Negative.   Psychiatric/Behavioral: Positive for depression and hallucinations.    Blood pressure 124/74, pulse 88, temperature 98.7 F (37.1 C), temperature source Oral, resp. rate 18, height 6' 0.5" (1.842 m), weight 127.461 kg (281 lb).Body mass index is 37.57 kg/(m^2).  General Appearance: Fairly Groomed  Patent attorney::   Fair  Speech:  Clear and Coherent  Volume:  Decreased  Mood:  Anxious and Depressed  Affect:  Restricted  Thought Process:  Coherent and Goal Directed  Orientation:  Full (Time, Place, and Person)  Thought Content:  symptoms events worries concerns  Suicidal Thoughts:  No "just the voices telling me to kill myself"  Homicidal Thoughts:  No  Memory:  Immediate;   Fair Recent;   Fair Remote;   Fair  Judgement:  Fair  Insight:  Present and Shallow  Psychomotor Activity:  Decreased  Concentration:  Fair  Recall:  [employer] of Knowledge:Fair  Language: Fair  Akathisia:  No  Handed:  Right  AIMS (if indicated):     Assets:  Desire for Improvement Housing  Sleep:  Number of Hours: 2.5  Cognition: WNL  ADL's:  Intact     COGNITIVE FEATURES THAT CONTRIBUTE TO RISK:  Closed-mindedness, Polarized thinking and Thought constriction (tunnel vision)    SUICIDE RISK:   Moderate:  Frequent suicidal ideation with limited intensity, and duration, some specificity in terms of plans, no associated intent, good self-control, limited dysphoria/symptomatology, some risk factors present, and identifiable protective factors, including available and accessible social support. 19 Y/o male who states he is hearing voices telling him to hurt himself and other people. He has been hearing them for 4 years. Denies any abuse when growing up. Denies any substance abuse. States he has coped with the voices  by putting on his head phones. He also admits to depression. He was depressed before the voices started. They have always been very negative. He finished HS and he is looking for work. He worked for 4 days at a place and he was told he was  not a good fit. States he has some stress from trying to find a job PLAN OF CARE: Supportive approach/coping skills                              Hallucinations; will go ahead and start Abilify.                              Depression; will reass for the use of an  antidepressant                               Will work on stress management                                Will also get collateral information  Medical Decision Making:  Review of Psycho-Social Stressors (1), Review or order clinical lab tests (1), Review of Medication Regimen & Side Effects (2) and Review of New Medication or Change in Dosage (2)  I certify that inpatient services furnished can reasonably be expected to improve the patient's condition.   Poetry Cerro A 09/11/2014, 6:52 PM

## 2014-09-11 NOTE — Progress Notes (Signed)
Pt is a 19 year old male admitted with psychotic symptoms   He reports hearing voices to harm himself and others    He said he has heard them for a while but they are getting more bothersome   He denies having treatment before and has no home medications   Pt has mild MR   He was cooperative and pleasant during the assessment   Pt admits to smoking Pot sometimes but denies and other drug or ETOH use   Pt lives with his mother but is reluctant to allow mother access to his information at this time   Pt voiced no other complaints    Provided nourishment for pt and offered verbal support   Oriented pt to the unit    Discussed medications but pt does not want them at present and wants to try to go back to sleep on his own   Q 15 min checks explained and implemented   Orders received and checked    Pt safe at present and eating his food and agrees to go back to bed when finished

## 2014-09-11 NOTE — Tx Team (Addendum)
Initial Interdisciplinary Treatment Plan   PATIENT STRESSORS: Medication change or noncompliance Substance abuse   PATIENT STRENGTHS: General fund of knowledge Supportive family/friends   PROBLEM LIST: Problem List/Patient Goals Date to be addressed Date deferred Reason deferred Estimated date of resolution  I want to get help for the voices that tell me to hurt myself and others 09/11/14                                                      DISCHARGE CRITERIA:  Improved stabilization in mood, thinking, and/or behavior Reduction of life-threatening or endangering symptoms to within safe limits Verbal commitment to aftercare and medication compliance  PRELIMINARY DISCHARGE PLAN: Attend aftercare/continuing care group Return to previous living arrangement  PATIENT/FAMIILY INVOLVEMENT: This treatment plan has been presented to and reviewed with the patient, Ronald Gray, and/or family member, .  The patient and family have been given the opportunity to ask questions and make suggestions.  Andrena Mews 09/11/2014, 2:34 AM

## 2014-09-11 NOTE — [facility] Counselor (Signed)
Adult Comprehensive Assessment  Patient ID: Ronald Gray, male   DOB: 1995/12/19, 19 y.o.   MRN: [id number]  Information Source: Information source: Patient  Current Stressors:  Educational / Learning stressors: Denies stressors Employment / Job issues: Trying to find a job has been very stressful - has been looking for about a year now Family Relationships: Sometimes they argue, but generally Geophysicist/field seismologist / Lack of resources (include bankruptcy): Would like to have his own income Housing / Lack of housing: Does not mind living with aunt and mom, but would like his own place Physical health (include injuries & life threatening diseases): Denies stressors Social relationships: Denies stressors Substance abuse: Denies stressors Bereavement / Loss: Denies stressors  Living/Environment/Situation:  Living Arrangements: Parent, Other relatives (Mother, Aunt, Kateri Mc, Cousin, Sister, sister's boyfriend, their 2 children) Living conditions (as described by patient or guardian): Has his own room, quiet, in the woods How long has patient lived in current situation?: 4 years What is atmosphere in current home: Comfortable, Supportive, Loving  Family History:  Marital status: Single Does patient have children?: No  Childhood History:  By whom was/is the patient raised?: Mother/father and step-parent Additional childhood history information: Raised in [place].  Mother, pt, sister and stepfather were in an accident last year and mother has not been able to work since then.  Biological father - not seen since pt was 4-5yo. Description of patient's relationship with caregiver when they were a child: For awhile stayed with an aunt, not sure why.  Feels relationship was pretty good as a child with both mother and stepfather. Patient's description of current relationship with people who raised him/her: States that his relationship with his mother is pretty good, with typical arguments.   Still sees and talks to stepfather sometimes. Does patient have siblings?: Yes Number of Siblings: 3 Description of patient's current relationship with siblings: Has 1 younger sister, with typical sibling relationship, closer as they are getting older, and 2 much older brothers.  He talks to brothers, but not close. Did patient suffer any verbal/emotional/physical/sexual abuse as a child?: No Did patient suffer from severe childhood neglect?: No Has patient ever been sexually abused/assaulted/raped as an adolescent or adult?: No Was the patient ever a victim of a crime or a disaster?: No Witnessed domestic violence?: Yes Has patient been effected by domestic violence as an adult?: No Description of domestic violence: Mother and stepfather would argue sometimes, but were not violent toward each other.  Education:  Highest grade of school patient has completed: Graduated high school in 2015 Currently a student?: No Learning disability?: Yes What learning problems does patient have?: Attention Deficit Disorder   Employment/Work Situation:   Employment situation: Unemployed What is the longest time patient has a held a job?: 4 days Where was the patient employed at that time?: [employer] Has patient ever been in the [employer]?: No Has patient ever served in Buyer, retail?: No  Financial Resources:   Surveyor, quantity resources: Support from parents / caregiver, Medicaid Does patient have a Lawyer or guardian?: No  Alcohol/Substance Abuse:   What has been your use of drugs/alcohol within the last 12 months?: Uses marijuana occasionally Alcohol/Substance Abuse Treatment Hx: Denies past history Has alcohol/substance abuse ever caused legal problems?: No  Social Support System:   Conservation officer, nature Support System: Good Describe Community Support System: Mother, aunt, family members Type of faith/religion: None How does patient's faith help to cope with current illness?:  N/A  Leisure/Recreation:   Leisure and Hobbies:  Listen to music, draw, play games  Strengths/Needs:   What things does the patient do well?: "I can't really think of anything at the moment." In what areas does patient struggle / problems for patient: Trying to get a job and support himself.  Discharge Plan:   Does patient have access to transportation?: Yes Will patient be returning to same living situation after discharge?: Yes Currently receiving community mental health services: No If no, would patient like referral for services when discharged?: Yes (What county?) ([place]) - medication possibly would help, he says) Does patient have financial barriers related to discharge medications?: No  Summary/Recommendations:   Summary and Recommendations (to be completed by the evaluator): Ronald Gray is a [place] male hospitalized with command-type AH telling him to kill himself or others, occurring over last several years.  He smokes marijuana occasionally, lives with mother and numerous other family members, has no mental health providers and is willing to be referred for medication management only.   The patient would benefit from safety monitoring, medication evaluation, psychoeducation, group therapy, and discharge planning to link with ongoing resources. The patient refused referral to [facility] for smoking cessation, says he can quit whenever he wants.  The Discharge Process and Patient Involvement form was reviewed with patient at the end of the Psychosocial Assessment, and the patient confirmed understanding and signed that document, which was placed in the paper chart. Suicide Prevention Education was reviewed and a brochure left with patient.  The patient refused consent for SPE to be provided to someone in his life.   Sarina Ser. 09/11/2014

## 2014-09-12 NOTE — [facility] Group Notes (Signed)
[facility] Group Notes:  (Nursing/MHT/Case Management/Adjunct)  Date:  09/12/2014  Time:  11:08 AM  Type of Therapy:  Psychoeducational Skills  Participation Level:  Active  Participation Quality:  Appropriate  Affect:  Appropriate  Cognitive:  Appropriate  Insight:  Appropriate  Engagement in Group:  Engaged  Modes of Intervention:  Discussion  Summary of Progress/Problems: Pt did attend self inventory group, pt reported that he was negative SI/HI, no AH/VH noted. Pt rated his depression as a 0, and his helplessness/hopelessness as a 0.     Pt reported no issues or concerns.   Jacquelyne Balint Shanta 09/12/2014, 11:08 AM

## 2014-09-12 NOTE — [facility] Group Notes (Signed)
[facility] Group Notes:  (Clinical Social Work)  09/12/2014  [facility] Group Notes:  (Clinical Social Work)  09/12/2014  11:00AM-12:00PM  Summary of Progress/Problems:  The main focus of today's process group was to listen to a variety of genres of music and to identify that different types of music provoke different responses.  The patient then was able to identify personally what was soothing for them, as well as energizing.  Handouts were used to record feelings evoked, as well as how patient can personally use this knowledge in sleep habits, with depression, and with other symptoms.  The patient expressed understanding of concepts, as well as knowledge of how each type of music affected him/her and how this can be used at home as a wellness/recovery tool.  He was very engaged but did not talk much.  Type of Therapy:  Music Therapy   Participation Level:  Active  Participation Quality:  Attentive and Sharing  Affect:  Blunted  Cognitive:  Oriented  Insight:  Engaged  Engagement in Therapy:  Engaged  Modes of Intervention:   Activity, Exploration  Ambrose Mantle, LCSW 09/12/2014

## 2014-09-12 NOTE — Progress Notes (Signed)
Patient ID: Ronald Gray, male   DOB: June 23, 1995, 19 y.o.   MRN: [id number]   Pt signed a 72 hour request for discharge on 08/12/14 @ 6:30pm, Vernona Rieger NP made aware. Jacquelyne Balint RN

## 2014-09-12 NOTE — Plan of Care (Signed)
Problem: Diagnosis: Increased Risk For Suicide Attempt Goal: STG-Patient Will Attend All Groups On The Unit Outcome: Progressing Pt attended evening group on 09/11/14.

## 2014-09-12 NOTE — Progress Notes (Signed)
D Pt. Denies SI and HI, no complaints of pain or discomfort noted this pm.   A Writer offered support and encouragement, discussed pt.'s over day and outcome of the medication.  R Pt. Reports the voices are now gone and contributes it to the medication he has received.  Pt. Has also signed a 72 hour request for discharge.  Pt. Is interacting in the day room and attending groups.  Pt. Remains safe on the unit.

## 2014-09-12 NOTE — Progress Notes (Signed)
[facility] MD Progress Note  09/12/2014 2:30 PM Ronald Gray  MRN:  [id number] Subjective:   Patient states "I am tolerating my new medication. I want to try that to how it does. I am hearing less voices. I want to wait to see if I need another medication. I am hoping this one can help my depression and the voices that I hear. I am less depressed. I live with several family members at home and it causes stress. But I do not think that is my problem."  Objective:  Patient seen and chart is reviewed. Ronald Gray is an 19 y.o. male presenting to [facility] reporting command auditory hallucinations. Patient stated "for the past four years I have been hearing voices". "Sometimes it's a whisper and other times it's like a regular conversation telling me to hurt myself and others". "I have dreams where I hurt myself and others". Patient is endorsing SI and HI but denies having an active plan. Patient is also reporting auditory hallucinations and stated "I hear the voices saying I'm nothing, I'm worthless and I should take my pain out on others". Patient reported that he always has thoughts of hurting himself due to the voices telling him to harm himself. Patient did not report any previous suicide attempts or psychiatric hospitalizations. The patient was started on Abilify yesterday and reports he is tolerating the medication so far. He is reporting a decrease in auditory hallucinations but also states "They come and go." Denies intent to harm self and reports a decrease in depressive symptoms. Patient is visible on the unit and has been attending the scheduled groups.   Principal Problem: Severe major depression with psychotic features, mood-congruent Diagnosis:   Patient Active Problem List   Diagnosis Date Noted  . 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 Severe major depression with psychotic features, mood-congruent [F32.3] 09/11/2014   Total Time spent with patient: 20 minutes  Past  Medical History: History reviewed. No pertinent past medical history. History reviewed. No pertinent past surgical history. Family History: History reviewed. No pertinent family history. Social History:  History  Alcohol Use No     History  Drug Use No    Social History   Social History  . Marital Status: Single    Spouse Name: N/A  . Number of Children: N/A  . Years of Education: N/A   Social History Main Topics  . Smoking status: Never Smoker   . Smokeless tobacco: None  . Alcohol Use: No  . Drug Use: No  . Sexual Activity: Not Asked   Other Topics Concern  . None   Social History Narrative   Additional History:    Sleep: Fair  Appetite:  Good  Musculoskeletal: Strength & Muscle Tone: within normal limits Gait & Station: normal Patient leans: N/A  Psychiatric Specialty Exam: Physical Exam  Psychiatric: His speech is normal and behavior is normal. He exhibits a depressed mood. He expresses suicidal ideation.    Review of Systems  Constitutional: Negative.   HENT: Negative.   Eyes: Negative.   Respiratory: Negative.   Cardiovascular: Negative.   Gastrointestinal: Negative.   Genitourinary: Negative.   Musculoskeletal: Negative.   Skin: Negative.   Neurological: Negative.   Endo/Heme/Allergies: Negative.   Psychiatric/Behavioral: Positive for depression, suicidal ideas and hallucinations. The patient is nervous/anxious.     Blood pressure 103/60, pulse 84, temperature 98.2 F (36.8 C), temperature source Oral, resp. rate 18, height 6' 0.5" (1.842 m), weight 127.461 kg (281 lb).Body mass index  is 37.57 kg/(m^2).  General Appearance: Fairly Groomed  Engineer, water::  Good  Speech:  Clear and Coherent  Volume:  Normal  Mood:  Anxious  Affect:  Constricted  Thought Process:  Goal Directed and Intact  Orientation:  Full (Time, Place, and Person)  Thought Content:  Hallucinations: Auditory  Suicidal Thoughts:  No  Homicidal Thoughts:  No  Memory:   Immediate;   Fair Recent;   Fair Remote;   Fair  Judgement:  Fair  Insight:  Present and Shallow  Psychomotor Activity:  Decreased  Concentration:  Fair  Recall:  [employer] of Knowledge:Fair  Language: Good  Akathisia:  No  Handed:  Right  AIMS (if indicated):     Assets:  Communication Skills Desire for Improvement Housing Leisure Time Physical Health Resilience Social Support  ADL's:  Intact  Cognition: WNL  Sleep:  Number of Hours: 6.75     Current Medications: Current Facility-Administered Medications  Medication Dose Route Frequency Provider Last Rate Last Dose  . acetaminophen (TYLENOL) tablet 650 mg  650 mg Oral Q6H PRN Harriet Butte, NP   650 mg at 09/11/14 1952  . alum & mag hydroxide-simeth (MAALOX/MYLANTA) 200-200-20 MG/5ML suspension 30 mL  30 mL Oral Q4H PRN Harriet Butte, NP      . ARIPiprazole (ABILIFY) tablet 2 mg  2 mg Oral Daily Niel Hummer, NP   2 mg at 09/12/14 1884  . hydrOXYzine (ATARAX/VISTARIL) tablet 25 mg  25 mg Oral Q6H PRN Niel Hummer, NP      . Influenza vac split quadrivalent PF (FLUARIX) injection 0.5 mL  0.5 mL Intramuscular Tomorrow-1000 Saramma Eappen, MD      . magnesium hydroxide (MILK OF MAGNESIA) suspension 30 mL  30 mL Oral Daily PRN Harriet Butte, NP      . OLANZapine zydis (ZYPREXA) disintegrating tablet 5 mg  5 mg Oral Q8H PRN Niel Hummer, NP      . traZODone (DESYREL) tablet 50 mg  50 mg Oral QHS PRN Niel Hummer, NP        Lab Results:  Results for orders placed or performed during the hospital encounter of 09/10/14 (from the past 48 hour(s))  CBC WITH DIFFERENTIAL     Status: Abnormal   Collection Time: 09/10/14  6:27 PM  Result Value Ref Range   WBC 11.6 (H) 4.0 - 10.5 K/uL   RBC 5.17 4.22 - 5.81 MIL/uL   Hemoglobin 14.8 13.0 - 17.0 g/dL   HCT 43.7 39.0 - 52.0 %   MCV 84.5 78.0 - 100.0 fL   MCH 28.6 26.0 - 34.0 pg   MCHC 33.9 30.0 - 36.0 g/dL   RDW 12.7 11.5 - 15.5 %   Platelets 300 150 - 400 K/uL    Neutrophils Relative % 67 43 - 77 %   Neutro Abs 7.7 1.7 - 7.7 K/uL   Lymphocytes Relative 25 12 - 46 %   Lymphs Abs 2.9 0.7 - 4.0 K/uL   Monocytes Relative 5 3 - 12 %   Monocytes Absolute 0.6 0.1 - 1.0 K/uL   Eosinophils Relative 3 0 - 5 %   Eosinophils Absolute 0.4 0.0 - 0.7 K/uL   Basophils Relative 0 0 - 1 %   Basophils Absolute 0.0 0.0 - 0.1 K/uL  Comprehensive metabolic panel     Status: Abnormal   Collection Time: 09/10/14  6:27 PM  Result Value Ref Range   Sodium 141 135 - 145 mmol/L  Potassium 4.4 3.5 - 5.1 mmol/L   Chloride 105 101 - 111 mmol/L   CO2 26 22 - 32 mmol/L   Glucose, Bld 102 (H) 65 - 99 mg/dL   BUN 12 6 - 20 mg/dL   Creatinine, Ser 0.81 0.61 - 1.24 mg/dL   Calcium 10.0 8.9 - 10.3 mg/dL   Total Protein 8.7 (H) 6.5 - 8.1 g/dL   Albumin 5.2 (H) 3.5 - 5.0 g/dL   AST 39 15 - 41 U/L   ALT 35 17 - 63 U/L   Alkaline Phosphatase 88 38 - 126 U/L   Total Bilirubin 0.6 0.3 - 1.2 mg/dL   GFR calc non Af Amer >60 >60 mL/min   GFR calc Af Amer >60 >60 mL/min    Comment: (NOTE) The eGFR has been calculated using the CKD EPI equation. This calculation has not been validated in all clinical situations. eGFR's persistently <60 mL/min signify possible Chronic Kidney Disease.    Anion gap 10 5 - 15  Ethanol     Status: None   Collection Time: 09/10/14  6:27 PM  Result Value Ref Range   Alcohol, Ethyl (B) <5 <5 mg/dL    Comment:        LOWEST DETECTABLE LIMIT FOR SERUM ALCOHOL IS 5 mg/dL FOR MEDICAL PURPOSES ONLY     Physical Findings: AIMS: Facial and Oral Movements Muscles of Facial Expression: None, normal Lips and Perioral Area: None, normal Jaw: None, normal Tongue: None, normal,Extremity Movements Upper (arms, wrists, hands, fingers): None, normal Lower (legs, knees, ankles, toes): None, normal, Trunk Movements Neck, shoulders, hips: None, normal, Overall Severity Severity of abnormal movements (highest score from questions above): None,  normal Incapacitation due to abnormal movements: None, normal Patient's awareness of abnormal movements (rate only patient's report): No Awareness, Dental Status Current problems with teeth and/or dentures?: No Does patient usually wear dentures?: No  CIWA:    COWS:     Treatment Plan Summary: Daily contact with patient to assess and evaluate symptoms and progress in treatment and Medication management  Continue crisis management and stabilization.  Medication management:  -Continue Abilify 2 mg daily for depression and psychosis -Continue Trazodone 50 mg hs prn insomnia  -Continue vistaril 25 mg every six hours prn anxiety  Encouraged patient to attend groups and participate in group counseling sessions and activities.  Discharge plan in progress.  Continue current treatment plan.  Address health issues: Vitals reviewed and stable.   Medical Decision Making:  Review of Psycho-Social Stressors (1), Review or order clinical lab tests (1), New Problem, with no additional work-up planned (3), Review of Medication Regimen & Side Effects (2) and Review of New Medication or Change in Dosage (2)  DAVIS, LAURA, NP-C 09/12/2014, 2:30 PM I agree with assessment and plan Geralyn Flash A. Sabra Heck, M.D.

## 2014-09-12 NOTE — Progress Notes (Signed)
Adult Psychoeducational Group Note  Date:  09/12/2014 Time:  9:30 PM  Group Topic/Focus:  Wrap-Up Group:   The focus of this group is to help patients review their daily goal of treatment and discuss progress on daily workbooks.  Participation Level:  Active  Participation Quality:  Appropriate  Affect:  Appropriate  Cognitive:  Appropriate  Insight: Appropriate  Engagement in Group:  Engaged  Modes of Intervention:  Discussion  Additional Comments: The patient expressed that he had a good day.The patient also said that he feels better.  Octavio Manns 09/12/2014, 9:30 PM

## 2014-09-12 NOTE — Progress Notes (Addendum)
Patient ID: Ronald Gray, male   DOB: 17-Dec-1995, 19 y.o.   MRN: [id number]   D: Pt has been appropriate on the unit today. Patient attended all groups and engaged in treatment. Pt reported that he felt much better since starting Abilify. Pt reported no side effects to the medication, and no other issues or concerns. Pt was given his flu shot with incident. Pt reported that his depression was a 0, his hopelessness was a 0, and his anxiety was a 0. Pt reported being negative SI/HI, no AH/VH noted. A: 15 min checks continued for patient safety. R: Pt safety maintained.

## 2014-09-13 ENCOUNTER — Encounter (HOSPITAL_COMMUNITY): Payer: Self-pay | Admitting: Psychiatry

## 2014-09-13 DIAGNOSIS — F1220 Cannabis dependence, uncomplicated: Secondary | ICD-10-CM

## 2014-09-13 MED ORDER — CITALOPRAM HYDROBROMIDE 10 MG PO TABS
10.0000 mg | ORAL_TABLET | Freq: Every day | ORAL | Status: DC
  Administered 2014-09-13 – 2014-09-14 (×2): 10 mg via ORAL
  Filled 2014-09-13 (×5): qty 1

## 2014-09-13 NOTE — Progress Notes (Signed)
Patient on the hallway during the assessment. His mood and affects appropriate. Patient reported that he had a good day. He denied SI/Hi and denied Hallucinations. He reported that his goal is to go home , get a job and stay on his medications so; " I don't hear voices anymore". Writer encouraged and supported patient. Patient receptive to encouragement and support. Q 15 minute check continues as ordered for safety.

## 2014-09-13 NOTE — Progress Notes (Addendum)
[facility] MD Progress Note  09/13/2014 2:11 PM Ronald Gray  MRN:  [id number] Subjective:   Patient states "I feel better today , I feel my medications are working.'     Objective:  Patient seen and chart is reviewed. Ronald Gray is an 19 y.o. male presenting to [facility] reporting command auditory hallucinations. Pt reported that AH asks him to hurt self or others . Pt also reported depression for the past several days and also cannabis abuse.  I have discussed patient with treatment team. Pt has been showing response to his current medications. Pt denies any side effects today. Reports AH as improving. Pt with likely AH 2/2 his affective sx. Will consider an antidepressant . Pt is calm , appropriate on the unit. No disruptive issues noted per staff. Pt encouraged to attend groups .      Principal Problem: Severe major depression with psychotic features, mood-congruent Diagnosis:   Patient Active Problem List   Diagnosis Date Noted  . Cannabis use disorder, moderate, dependence [F12.20] 09/13/2014  . Severe major depression with psychotic features, mood-congruent [F32.3] 09/11/2014   Total Time spent with patient: 25 MINUTES  Past Medical History:  Past Medical History  Diagnosis Date  . Depression    History reviewed. No pertinent past surgical history. Family History: History reviewed. No pertinent family history. Social History:  History  Alcohol Use No     History  Drug Use No    Social History   Social History  . Marital Status: Single    Spouse Name: N/A  . Number of Children: N/A  . Years of Education: N/A   Social History Main Topics  . Smoking status: Never Smoker   . Smokeless tobacco: None  . Alcohol Use: No  . Drug Use: No  . Sexual Activity: Not Asked   Other Topics Concern  . None   Social History Narrative   Additional History:    Sleep: Fair  Appetite:  Good  Musculoskeletal: Strength & Muscle Tone: within normal  limits Gait & Station: normal Patient leans: N/A  Psychiatric Specialty Exam: Physical Exam  Psychiatric: His speech is normal and behavior is normal. He exhibits a depressed mood. He expresses suicidal ideation.    Review of Systems  Constitutional: Negative.   HENT: Negative.   Eyes: Negative.   Respiratory: Negative.   Cardiovascular: Negative.   Gastrointestinal: Negative.   Genitourinary: Negative.   Musculoskeletal: Negative.   Skin: Negative.   Neurological: Negative.   Endo/Heme/Allergies: Negative.   Psychiatric/Behavioral: Positive for depression, suicidal ideas and hallucinations. The patient is nervous/anxious.   All other systems reviewed and are negative.   Blood pressure 131/60, pulse 78, temperature 98.4 F (36.9 C), temperature source Oral, resp. rate 20, height 6' 0.5" (1.842 m), weight 127.461 kg (281 lb).Body mass index is 37.57 kg/(m^2).  General Appearance: Fairly Groomed  Patent attorney::  Good  Speech:  Clear and Coherent  Volume:  Normal  Mood:  Anxious and Depressed  Affect:  Depressed  Thought Process:  Goal Directed and Intact  Orientation:  Full (Time, Place, and Person)  Thought Content:  Hallucinations: Auditory improving  Suicidal Thoughts:  No  Homicidal Thoughts:  No  Memory:  Immediate;   Fair Recent;   Fair Remote;   Fair  Judgement:  Fair  Insight:  Shallow  Psychomotor Activity:  Decreased  Concentration:  Fair  Recall:  [employer] of Knowledge:Fair  Language: Good  Akathisia:  No  Handed:  Right  AIMS (if indicated):     Assets:  Communication Skills Desire for Improvement Housing Leisure Time Physical Health Resilience Social Support  ADL's:  Intact  Cognition: WNL  Sleep:  Number of Hours: 6.75     Current Medications: Current Facility-Administered Medications  Medication Dose Route Frequency Provider Last Rate Last Dose  . acetaminophen (TYLENOL) tablet 650 mg  650 mg Oral Q6H PRN Worthy Flank, NP   650 mg at  09/11/14 1952  . alum & mag hydroxide-simeth (MAALOX/MYLANTA) 200-200-20 MG/5ML suspension 30 mL  30 mL Oral Q4H PRN Worthy Flank, NP      . ARIPiprazole (ABILIFY) tablet 2 mg  2 mg Oral Daily Thermon Leyland, NP   2 mg at 09/13/14 0837  . citalopram (CELEXA) tablet 10 mg  10 mg Oral Daily Kamrie Fanton, MD      . hydrOXYzine (ATARAX/VISTARIL) tablet 25 mg  25 mg Oral Q6H PRN Thermon Leyland, NP      . magnesium hydroxide (MILK OF MAGNESIA) suspension 30 mL  30 mL Oral Daily PRN Worthy Flank, NP      . OLANZapine zydis (ZYPREXA) disintegrating tablet 5 mg  5 mg Oral Q8H PRN Thermon Leyland, NP      . traZODone (DESYREL) tablet 50 mg  50 mg Oral QHS PRN Thermon Leyland, NP        Lab Results:  No results found for this or any previous visit (from the past 48 hour(s)).  Physical Findings: AIMS: Facial and Oral Movements Muscles of Facial Expression: None, normal Lips and Perioral Area: None, normal Jaw: None, normal Tongue: None, normal,Extremity Movements Upper (arms, wrists, hands, fingers): None, normal Lower (legs, knees, ankles, toes): None, normal, Trunk Movements Neck, shoulders, hips: None, normal, Overall Severity Severity of abnormal movements (highest score from questions above): None, normal Incapacitation due to abnormal movements: None, normal Patient's awareness of abnormal movements (rate only patient's report): No Awareness, Dental Status Current problems with teeth and/or dentures?: No Does patient usually wear dentures?: No  CIWA:  CIWA-Ar Total: 1 COWS:  COWS Total Score: 1   Assessment: Pt is a 81 y old CM with hx of depression , cannabis abuse and psychosis. Pt has been showing improvement on current medication regimen.   Treatment Plan Summary: Daily contact with patient to assess and evaluate symptoms and progress in treatment and Medication management  Continue crisis management and stabilization.  Medication management:  -Start Celexa 10 mg po daily for  affective sx. -Continue Abilify 2 mg daily for depression and psychosis -Continue Trazodone 50 mg hs prn insomnia  -Continue vistaril 25 mg every six hours prn anxiety  Will get TSH,lipid panel, ekg. - Pt to be referred to substance abuse program - has hx of cannabis abuse. Encouraged patient to attend groups and participate in group counseling sessions and activities.  Discharge plan in progress.  Continue current treatment plan.  Address health issues: Vitals reviewed and stable.   Medical Decision Making:  Review of Psycho-Social Stressors (1), Review or order clinical lab tests (1), New Problem, with no additional work-up planned (3), Review of Medication Regimen & Side Effects (2) and Review of New Medication or Change in Dosage (2)  Okie Jansson, MD 09/13/2014, 2:11 PM

## 2014-09-13 NOTE — Progress Notes (Signed)
EKG COMPLETED AND GIVEN TO MD FOR REVIEW.  

## 2014-09-13 NOTE — [facility] Group Notes (Signed)
[facility] LCSW Aftercare Discharge Planning Group Note   09/13/2014 11:32 AM  Participation Quality:  Engaged  Mood/Affect:  Flat  Depression Rating:  denies  Anxiety Rating:  denies  Thoughts of Suicide:  No Will you contract for safety?   NA  Current AVH:  Denies  Plan for Discharge/Comments:  "I came in to get help with voices.  They have been going on for a long time, but this is the first time I went for help.  I guess I just got tired of it."  Fine with current meds.  Not c/o any side effects, and states that he is no longer hearing voices.  Appetite and sleep are good.  Transportation Means:   Supports:  Daryel Gerald B

## 2014-09-13 NOTE — Plan of Care (Signed)
Problem: Consults Goal: Suicide Risk Patient Education (See Patient Education module for education specifics)  Outcome: Completed/Met Date Met:  09/13/14 Nurse discussed suicidal information/anxiety/depression/coping skills with patient.

## 2014-09-13 NOTE — Tx Team (Signed)
Interdisciplinary Treatment Plan Update (Adult)  Date:  09/13/2014   Time Reviewed:  11:27 AM   Progress in Treatment: Attending groups: Yes. Participating in groups:  Yes. Taking medication as prescribed:  Yes. Tolerating medication:  Yes. Family/Significant othe contact made:  No Patient understands diagnosis:  Yes  As evidenced by seeking help with AH Discussing patient identified problems/goals with staff:  Yes, see initial care plan. Medical problems stabilized or resolved:  Yes. Denies suicidal/homicidal ideation: Yes. Issues/concerns per patient self-inventory:  No. Other:  New problem(s) identified:  Discharge Plan or Barriers:  Return home, follow up outpt  Reason for Continuation of Hospitalization: Hallucinations Medication stabilization  Comments:  Ronald Gray is an 19 y.o. male presenting to [facility] reporting command auditory hallucinations. Pt stated "for the past 4 years I have been hearing voices". "Sometimes it's a whisper and other times it's like a regular conversation telling mem to hurt myself and others". "I have dreams where I hurt myself and others". Pt is endorsing SI and HI but denies having an active plan. Pt is also reporting auditory hallucinations and stated "I hear the voices saying I'm nothing, I'm worthless and I should take my pain out on others". Pt reported that he always has thoughts of hurting himself due to the voices telling him to harm himself. Pt did not report any previous suicide attempts or psychiatric hospitalizations. Pt is currently not receiving any mental health treatment. PT denies engaging in any self-injurious behaviors. Pt is endorsing multiple depressive symptoms and shared that finding a job is stressful.  Abilify, Celexa trial  Estimated length of stay:  Likely d/c tomorrow  New goal(s):    Review of initial/current patient goals per problem list:   Review of initial/current patient goals per problem list:  1.  Goal(s): Patient will participate in aftercare plan   Met: Yes   Target date: 3-5 days post admission date   As evidenced by: Patient will participate within aftercare plan AEB aftercare provider and housing plan at discharge being identified.  09/13/2014: Plans to return home, follow up outpt    5. Goal(s): Patient will demonstrate decreased signs of psychosis  * Met: Yes  * Target date: 3-5 days post admission date  * As evidenced by: Patient will demonstrate decreased frequency of AVH or return to baseline function  09/13/2014 Gerald Stabs denies voices today, is happy on meds prescribed him.         Attendees: Patient:  09/13/2014 11:27 AM   Family:   09/13/2014 11:27 AM   Physician:  Ursula Alert, MD 09/13/2014 11:27 AM   Nursing:   Manuella Ghazi, RN 09/13/2014 11:27 AM   CSW:    Roque Lias, LCSW   09/13/2014 11:27 AM   Other:  09/13/2014 11:27 AM   Other:   09/13/2014 11:27 AM   Other:  Lars Pinks, Nurse CM 09/13/2014 11:27 AM   Other:  Lucinda Dell, [facility] 09/13/2014 11:27 AM   Other:  Norberto Sorenson, [place]  09/13/2014 11:27 AM   Other:  09/13/2014 11:27 AM   Other:  09/13/2014 11:27 AM   Other:  09/13/2014 11:27 AM   Other:  09/13/2014 11:27 AM   Other:  09/13/2014 11:27 AM   Other:   09/13/2014 11:27 AM    Scribe for Treatment Team:   Trish Mage, 09/13/2014 11:27 AM

## 2014-09-13 NOTE — [facility] Group Notes (Signed)
[facility] LCSW Group Therapy  09/13/2014 1:15 pm  Type of Therapy: Process Group Therapy  Participation Level:  Active  Participation Quality:  Appropriate  Affect:  Flat  Cognitive:  Oriented  Insight:  Improving  Engagement in Group:  Limited  Engagement in Therapy:  Limited  Modes of Intervention:  Activity, Clarification, Education, Problem-solving and Support  Summary of Progress/Problems: Today's group addressed the issue of overcoming obstacles.  Patients were asked to identify their biggest obstacle post d/c that stands in the way of their on-going success, and then problem solve as to how to manage this.  Thayer Ohm identified his inability to find work as his main obstacle.  Talked about getting fired from his only job at a [employer] after only 4 days, "and I'm still mad about that."  As he talked more about it, he identified multiple obstacles.  He is not on the bus line, does not have a bike, the family only has one car that is either not running or out of gas, and he lives in [place].  Group was unable to come up with any valuable feedback other than encouraging him to introduce himself to neighbors [place] L[place]ler has been living there 4 years and does not know any of them] and ask if anyone has an extra bicycle he could use.  Ida Rogue 09/13/2014   3:18 PM

## 2014-09-13 NOTE — Progress Notes (Signed)
D:  Patient denied SI and HI, contracts for safety.  Denied A/V hallucinations.  Denied pain. A:  Medications administered per MD orders.  Emotional support and encouragement given patient. R:  Safety maintained with 15 minute checks.  

## 2014-09-13 NOTE — Progress Notes (Signed)
The focus of this group is to help patients review their daily goal of treatment and discuss progress on daily workbooks. Pt attended the evening group session and responded to all discussion prompts from the Writer. Pt shared that today was a good day on the unit, the highlight of which was a conversation with his aunt, whom he considers supportive of his seeking treatment. Pt reported having no additional needs from Nursing Staff this evening. Ronald Gray mentioned being hopeful he would be discharged in the next several days and felt like his medications were working well enough for him to leave. Pt's affect was appropriate.

## 2014-09-14 LAB — URINE DRUGS OF ABUSE SCREEN W ALC, ROUTINE (REF LAB)
Amphetamines, Urine: NEGATIVE ng/mL
Barbiturate, Ur: NEGATIVE ng/mL
Benzodiazepine Quant, Ur: NEGATIVE ng/mL
CANNABINOID QUANT UR: NEGATIVE ng/mL
COCAINE (METAB.): NEGATIVE ng/mL
Ethanol U, Quan: NEGATIVE %
METHADONE SCREEN, URINE: NEGATIVE ng/mL
Opiate Quant, Ur: NEGATIVE ng/mL
PHENCYCLIDINE, UR: NEGATIVE ng/mL
Propoxyphene, Urine: NEGATIVE ng/mL

## 2014-09-14 LAB — LIPID PANEL
CHOL/HDL RATIO: 3.4 ratio
Cholesterol: 158 mg/dL (ref 0–200)
HDL: 47 mg/dL (ref >40)
LDL CALC: 86 mg/dL (ref 0–99)
Triglycerides: 126 mg/dL (ref <150)
VLDL: 25 mg/dL (ref 0–40)

## 2014-09-14 LAB — TSH: TSH: 1.471 u[IU]/mL (ref 0.350–4.500)

## 2014-09-14 MED ORDER — TRAZODONE HCL 50 MG PO TABS: 50.0000 mg | ORAL_TABLET | Freq: Every evening | ORAL | Status: DC | PRN

## 2014-09-14 MED ORDER — ARIPIPRAZOLE 2 MG PO TABS: 2.0000 mg | ORAL_TABLET | Freq: Every day | ORAL | Status: DC

## 2014-09-14 MED ORDER — CITALOPRAM HYDROBROMIDE 10 MG PO TABS: 10.0000 mg | ORAL_TABLET | Freq: Every day | ORAL | Status: DC

## 2014-09-14 NOTE — [facility] Group Notes (Signed)
The focus of this group is to educate the patient on the purpose and policies of crisis stabilization and provide a format to answer questions about their admission.  The group details unit policies and expectations of patients while admitted.  Patient attended 0900 nurse education orientation group this morning.  Patient actively participated and had appropriate affect.  Patient was alert.  Appropriate insight and engagement appropriate.  Today patient will work on 3 goals for discharge.   

## 2014-09-14 NOTE — [facility] Suicide Risk Assessment (Signed)
[facility] Discharge Suicide Risk Assessment   Demographic Factors:  Male and Caucasian  Total Time spent with patient: 30 minutes  Musculoskeletal: Strength & Muscle Tone: within normal limits Gait & Station: normal Patient leans: N/A  Psychiatric Specialty Exam: Physical Exam  Review of Systems  Psychiatric/Behavioral: Negative for depression, suicidal ideas and hallucinations. The patient is not nervous/anxious.   All other systems reviewed and are negative.   Blood pressure 118/75, pulse 81, temperature 98.2 F (36.8 C), temperature source Oral, resp. rate 16, height 6' 0.5" (1.842 m), weight 127.461 kg (281 lb).Body mass index is 37.57 kg/(m^2).  General Appearance: Casual  Eye Contact::  Fair  Speech:  Normal Rate409  Volume:  Normal  Mood:  Euthymic  Affect:  Appropriate  Thought Process:  Coherent  Orientation:  Full (Time, Place, and Person)  Thought Content:  WDL  Suicidal Thoughts:  No  Homicidal Thoughts:  No  Memory:  Immediate;   Fair Recent;   Fair Remote;   Fair  Judgement:  Fair  Insight:  Fair  Psychomotor Activity:  Normal  Concentration:  Fair  Recall:  [employer] of Knowledge:Fair  Language: Fair  Akathisia:  No  Handed:  Right  AIMS (if indicated):     Assets:  Communication Skills Desire for Improvement  Sleep:  Number of Hours: 6.75  Cognition: WNL  ADL's:  Intact   Have you used any form of tobacco in the last 30 days? (Cigarettes, Smokeless Tobacco, Cigars, and/or Pipes): Yes  Has this patient used any form of tobacco in the last 30 days? (Cigarettes, Smokeless Tobacco, Cigars, and/or Pipes) Yes, A prescription for an FDA-approved tobacco cessation medication was offered at discharge and the patient refused  Mental Status Per Nursing Assessment::   On Admission:     Current Mental Status by Physician: pt denies SI/HI/AH/VH  Loss Factors: NA  Historical Factors: Impulsivity  Risk Reduction Factors:   Positive social  support  Continued Clinical Symptoms:  Previous Psychiatric Diagnoses and Treatments  Cognitive Features That Contribute To Risk:  None    Suicide Risk:  Minimal: No identifiable suicidal ideation.  Patients presenting with no risk factors but with morbid ruminations; may be classified as minimal risk based on the severity of the depressive symptoms  Principal Problem: Severe major depression with psychotic features, mood-congruent Discharge Diagnoses:  Patient Active Problem List   Diagnosis Date Noted  . Cannabis use disorder, moderate, dependence [F12.20] 09/13/2014  . Severe major depression with psychotic features, mood-congruent [F32.3] 09/11/2014    Follow-up Information    Follow up with [facility].      Follow up with [facility].   Why:  Go to the walk-in clinic M-F between 8 and 2PM daily for your hospital follow up appointment.  You should be prepared to spend several hours the first time, but after that you will be given appointment times and dates   Contact information:   [address]  [place]  [phone]      Plan Of Care/Follow-up recommendations:  Activity:  No restrictions Diet:  regular Tests:  as needed Other:  Follow up with after care  Is patient on multiple antipsychotic therapies at discharge:  No   Has Patient had three or more failed trials of antipsychotic monotherapy by history:  No  Recommended Plan for Multiple Antipsychotic Therapies: NA    Lilyauna Miedema md 09/14/2014, 8:38 AM

## 2014-09-14 NOTE — Progress Notes (Addendum)
D:  Patient denied SI and HI.  Denied A/V hallucinations.  Denied pain.  Patient's self inventory sheet, patient sleeps good, no sleep medication given.  Good appetite, high energy level, good concentration.  Denied depression, anxiety, hopeless.  Denied withdrawals.  Denied SI.  Denied Physical problems.  Denied pain.  Does have discharge plans. A:  Medications administered per MD orders.  Emotional support and encouragement given patient. R:  Safety maintained with 15 minute checks.

## 2014-09-14 NOTE — Progress Notes (Signed)
Discharge Note:  Patient discharged home.  Denied SI and HI.  Denied A/V hallucinations.  Denied pain.  Suicide prevention information given and discussed with patient who stated he understood and had no questions.  Patient stated he received all his belongings, clothing, toiletries, misc items, prescriptions, shoe laces, wrist bands, earrings, etc.  Patient stated he appreciated all assistance received from [facility] staff.

## 2014-09-14 NOTE — Discharge Summary (Signed)
Physician Discharge Summary Note  Patient:  Ronald Gray is an 19 y.o., male MRN:  [id number] DOB:  August 24, 1995 Patient phone:  [phone] (home)  Patient address:   [address] [place] [address],  Total Time spent with patient: 30 minutes  Date of Admission:  09/11/2014 Date of Discharge: 09/14/2014  Reason for Admission:  Psychotic symptoms, Treatment of depression  Principal Problem: Severe major depression with psychotic features, mood-congruent Discharge Diagnoses: Patient Active Problem List   Diagnosis Date Noted  . Cannabis use disorder, moderate, dependence [F12.20] 09/13/2014  . Severe major depression with psychotic features, mood-congruent [F32.3] 09/11/2014    Musculoskeletal: Strength & Muscle Tone: within normal limits Gait & Station: normal Patient leans: N/A  Psychiatric Specialty Exam: Physical Exam  Psychiatric: He has a normal mood and affect. His speech is normal and behavior is normal. Judgment and thought content normal. Cognition and memory are normal.    Review of Systems  Constitutional: Negative.   HENT: Negative.   Eyes: Negative.   Respiratory: Negative.   Cardiovascular: Negative.   Gastrointestinal: Negative.   Genitourinary: Negative.   Musculoskeletal: Negative.   Skin: Negative.   Neurological: Negative.   Endo/Heme/Allergies: Negative.   Psychiatric/Behavioral: Positive for depression (Stablized with treatments). Negative for suicidal ideas, hallucinations, memory loss and substance abuse. The patient is not nervous/anxious and does not have insomnia.     Blood pressure 118/75, pulse 81, temperature 98.2 F (36.8 C), temperature source Oral, resp. rate 16, height 6' 0.5" (1.842 m), weight 127.461 kg (281 lb).Body mass index is 37.57 kg/(m^2).  See Physician SRA     Have you used any form of tobacco in the last 30 days? (Cigarettes, Smokeless Tobacco, Cigars, and/or Pipes): Yes  Has this patient used any  form of tobacco in the last 30 days? (Cigarettes, Smokeless Tobacco, Cigars, and/or Pipes) Yes, A prescription for an FDA-approved tobacco cessation medication was offered at discharge and the patient refused  Past Medical History:  Past Medical History  Diagnosis Date  . Depression    History reviewed. No pertinent past surgical history. Family History: History reviewed. No pertinent family history. Social History:  History  Alcohol Use No     History  Drug Use No    Social History   Social History  . Marital Status: Single    Spouse Name: N/A  . Number of Children: N/A  . Years of Education: N/A   Social History Main Topics  . Smoking status: Never Smoker   . Smokeless tobacco: None  . Alcohol Use: No  . Drug Use: No  . Sexual Activity: Not Asked   Other Topics Concern  . None   Social History Narrative   Risk to Self: Is patient at risk for suicide?: Yes What has been your use of drugs/alcohol within the last 12 months?: Uses marijuana occasionally Risk to Others:   Prior Inpatient Therapy:   Prior Outpatient Therapy:    Level of Care:  OP  Hospital Course:    Ronald Gray is an 19 y.o. male presenting to [facility] reporting command auditory hallucinations. Patient stated "for the past four years I have been hearing voices". "Sometimes it's a whisper and other times it's like a regular conversation telling me to hurt myself and others". "I have dreams where I hurt myself and others". Patient is endorsing SI and HI but denies having an active plan. Patient is also reporting auditory hallucinations and stated "I hear the voices saying I'm  nothing, I'm worthless and I should take my pain out on others". Patient reported that he always has thoughts of hurting himself due to the voices telling him to harm himself. Patient did not report any previous suicide attempts or psychiatric hospitalizations. Patient is currently not receiving any mental health treatment.  Patient denies engaging in any self-injurious behaviors. Patient is endorsing multiple depressive symptoms and shared that finding a job is stressful. Patient reported that there are times he has trouble falling asleep but shared that he gets at least 7-8 hours of sleep. Patient did not report any issues with his appetite. Patient did not report any pending criminal charges or upcoming court dates. Patient reported that he smokes marijuana; however he did not provide any additional details regarding his use. Patient did not report any physical, sexual or emotional abuse. He denies past manic episodes, paranoia, delusions, visual hallucinations. He endorses that his main problems are with depression and psychosis. Patient reports "isolating in my room at least once every week. At times he reports not sleeping very much and then too much. He denies every intentionally hurting himself in the past. The patient has a urine drug screen that is pending.          Ronald Gray was admitted to the adult 500 unit. He was evaluated and his symptoms were identified. Medication management was discussed and initiated. The patient denied taking any psychotropic medications before. Patient was started on Abilify 2 mg daily for mood stability and psychosis. He was later started on Celexa 10 mg daily for depressive symptoms.  He was oriented to the unit and encouraged to participate in unit programming. Medical problems were identified and treated appropriately. Home medication was restarted as needed.        The patient was evaluated each day by a clinical provider to ascertain the patient's response to treatment.  Improvement was noted by the patient's report of decreasing symptoms, improved sleep and appetite, affect, medication tolerance, behavior, and participation in unit programming.  He was asked each day to complete a self inventory noting mood, mental status, pain, new symptoms, anxiety and concerns.          He responded well to medication and being in a therapeutic and supportive environment. Patient reported feeling positive effects from his medication regimen. He reported that the auditory hallucinations were steadily decreasing with the treatments. The psychotic symptoms appeared to be related to his affective symptoms.  He was calm on the unit with no reported behavioral problems.  Positive and appropriate behavior was noted and the patient was motivated for recovery.  The patient worked closely with the treatment team and case manager to develop a discharge plan with appropriate goals. Coping skills, problem solving as well as relaxation therapies were also part of the unit programming. Patient reported abuse of marijuana and was encouraged to stop using for improved mental health.          By the day of discharge he was in much improved condition than upon admission.  Symptoms were reported as significantly decreased or resolved completely. The patient denied SI/HI and voiced no AVH. He was motivated to continue taking medication with a goal of continued improvement in mental health.    Ronald Gray was discharged home with a plan to follow up as noted below.  Consults:  None  Significant Diagnostic Studies:  Chemistry panel, Lipid profile, CBC, TSH, EKG with normal QTc interval   Discharge Vitals:   Blood  pressure 118/75, pulse 81, temperature 98.2 F (36.8 C), temperature source Oral, resp. rate 16, height 6' 0.5" (1.842 m), weight 127.461 kg (281 lb). Body mass index is 37.57 kg/(m^2). Lab Results:   Results for orders placed or performed during the hospital encounter of 09/11/14 (from the past 72 hour(s))  Drugs of abuse scrn w alc, routine urine (ref lab)     Status: None   Collection Time: 09/11/14  5:50 PM  Result Value Ref Range   Amphetamines, Urine Negative Cutoff=1000 ng/mL    Comment: Amphetamine test includes Amphetamine and Methamphetamine.   Barbiturate, Ur Negative  Cutoff=300 ng/mL   Benzodiazepine Quant, Ur Negative Cutoff=300 ng/mL   Cannabinoid Quant, Ur Negative Cutoff=50 ng/mL   Cocaine (Metab.) Negative Cutoff=300 ng/mL   Opiate Quant, Ur Negative Cutoff=300 ng/mL    Comment: Opiate test includes Codeine and Morphine only.   Phencyclidine, Ur Negative Cutoff=25 ng/mL   Methadone Screen, Urine Negative Cutoff=300 ng/mL   Propoxyphene, Urine Negative Cutoff=300 ng/mL   Ethanol U, Quan Negative Cutoff=0.020 %    Comment: (NOTE) Performed At: [facility] [address] [place], [place] [id number] Clent Demark MD Ph:[phone] Performed at [facility]   TSH     Status: None   Collection Time: 09/14/14  6:45 AM  Result Value Ref Range   TSH 1.471 0.350 - 4.500 uIU/mL    Comment: Performed at [facility]  Lipid panel     Status: None   Collection Time: 09/14/14  6:45 AM  Result Value Ref Range   Cholesterol 158 0 - 200 mg/dL   Triglycerides 829 <562 mg/dL   HDL 47 >13 mg/dL   Total CHOL/HDL Ratio 3.4 RATIO   VLDL 25 0 - 40 mg/dL   LDL Cholesterol 86 0 - 99 mg/dL    Comment:        Total Cholesterol/HDL:CHD Risk Coronary Heart Disease Risk Table                     Men   Women  1/2 Average Risk   3.4   3.3  Average Risk       5.0   4.4  2 X Average Risk   9.6   7.1  3 X Average Risk  23.4   11.0        Use the calculated Patient Ratio above and the CHD Risk Table to determine the patient's CHD Risk.        ATP III CLASSIFICATION (LDL):  <100     mg/dL   Optimal  086-578  mg/dL   Near or Above                    Optimal  130-159  mg/dL   Borderline  469-629  mg/dL   High  >528     mg/dL   Very High Performed at [facility]     Physical Findings: AIMS: Facial and Oral Movements Muscles of Facial Expression: None, normal Lips and Perioral Area: None, normal Jaw: None, normal Tongue: None, normal,Extremity Movements Upper (arms, wrists, hands, fingers): None, normal Lower  (legs, knees, ankles, toes): None, normal, Trunk Movements Neck, shoulders, hips: None, normal, Overall Severity Severity of abnormal movements (highest score from questions above): None, normal Incapacitation due to abnormal movements: None, normal Patient's awareness of abnormal movements (rate only patient's report): No Awareness, Dental Status Current problems with teeth and/or dentures?: No Does patient usually  wear dentures?: No  CIWA:  CIWA-Ar Total: 1 COWS:  COWS Total Score: 2   See Psychiatric Specialty Exam and Suicide Risk Assessment completed by Attending Physician prior to discharge.  Discharge destination:  Home  Is patient on multiple antipsychotic therapies at discharge:  No   Has Patient had three or more failed trials of antipsychotic monotherapy by history:  No  Recommended Plan for Multiple Antipsychotic Therapies: NA     Medication List    TAKE these medications      Indication   ARIPiprazole 2 MG tablet  Commonly known as:  ABILIFY  Take 1 tablet (2 mg total) by mouth daily.   Indication:  Major Depressive Disorder     citalopram 10 MG tablet  Commonly known as:  CELEXA  Take 1 tablet (10 mg total) by mouth daily.   Indication:  Depression     traZODone 50 MG tablet  Commonly known as:  DESYREL  Take 1 tablet (50 mg total) by mouth at bedtime as needed for sleep.   Indication:  Trouble Sleeping       Follow-up Information    Follow up with [facility].   Why:  Go to the walk-in clinic M-F between 8 and 2PM daily for your hospital follow up appointment.  You should be prepared to spend several hours the first time, but after that you will be given appointment times and dates   Contact information:   [address]  [place]  [336] [phone]      Follow-up recommendations:   Activity: No restrictions Diet: regular Tests: as needed Other: Follow up with after care  Comments:   Take all your medications as prescribed by your mental  healthcare provider.  Report any adverse effects and or reactions from your medicines to your outpatient provider promptly.  Patient is instructed and cautioned to not engage in alcohol and or illegal drug use while on prescription medicines.  In the event of worsening symptoms, patient is instructed to call the crisis hotline, 911 and or go to the nearest ED for appropriate evaluation and treatment of symptoms.  Follow-up with your primary care provider for your other medical issues, concerns and or health care needs.   Total Discharge Time: Greater than 30 minutes  Signed: Fransisca Kaufmann, NP-C 09/14/2014 15:36           09/14/2014, 3:35 PM

## 2014-09-14 NOTE — Progress Notes (Signed)
  [facility] Adult Case Management Discharge Plan :  Will you be returning to the same living situation after discharge:  Yes,  home At discharge, do you have transportation home?: Yes,  family Do you have the ability to pay for your medications: Yes,  MCD  Release of information consent forms completed and in the chart;  Patient's signature needed at discharge.  Patient to Follow up at: Follow-up Information    Follow up with [facility].   Why:  Go to the walk-in clinic M-F between 8 and 2PM daily for your hospital follow up appointment.  You should be prepared to spend several hours the first time, but after that you will be given appointment times and dates   Contact information:   [address]  [place]  [336] [phone]      Patient denies SI/HI: Yes,  yes    Safety Planning and Suicide Prevention discussed: Yes,  yes  Have you used any form of tobacco in the last 30 days? (Cigarettes, Smokeless Tobacco, Cigars, and/or Pipes): Yes  Has patient been referred to the Quitline?: Patient refused referral  Ronald Gray 09/14/2014, 10:17 AM

## 2016-04-15 ENCOUNTER — Emergency Department (HOSPITAL_COMMUNITY): Payer: Self-pay

## 2016-04-15 ENCOUNTER — Encounter (HOSPITAL_COMMUNITY): Payer: Self-pay | Admitting: *Deleted

## 2016-04-15 DIAGNOSIS — Z91040 Latex allergy status: Secondary | ICD-10-CM | POA: Insufficient documentation

## 2016-04-15 DIAGNOSIS — R0789 Other chest pain: Secondary | ICD-10-CM | POA: Insufficient documentation

## 2016-04-15 NOTE — ED Triage Notes (Signed)
Pt c/o central chest pain x 1 month with pain that radiates to his abdomen

## 2016-04-16 ENCOUNTER — Emergency Department (HOSPITAL_COMMUNITY)
Admission: EM | Admit: 2016-04-16 | Discharge: 2016-04-16 | Disposition: A | Discharge status: Home / Self Care | Payer: Self-pay | Attending: Emergency Medicine | Admitting: Emergency Medicine

## 2016-04-16 DIAGNOSIS — R0789 Other chest pain: Secondary | ICD-10-CM

## 2016-04-16 LAB — BASIC METABOLIC PANEL
ANION GAP: 8 (ref 5–15)
BUN: 14 mg/dL (ref 6–20)
CO2: 28 mmol/L (ref 22–32)
Calcium: 9.4 mg/dL (ref 8.9–10.3)
Chloride: 102 mmol/L (ref 101–111)
Creatinine, Ser: 0.7 mg/dL (ref 0.61–1.24)
GFR calc Af Amer: >60 mL/min (ref >60)
GLUCOSE: 106 mg/dL — AB (ref 65–99)
POTASSIUM: 4 mmol/L (ref 3.5–5.1)
Sodium: 138 mmol/L (ref 135–145)

## 2016-04-16 LAB — TROPONIN I: Troponin I: <0.03 ng/mL (ref <0.03)

## 2016-04-16 LAB — CBC
HEMATOCRIT: 40.8 % (ref 39.0–52.0)
HEMOGLOBIN: 14.1 g/dL (ref 13.0–17.0)
MCH: 29.7 pg (ref 26.0–34.0)
MCHC: 34.6 g/dL (ref 30.0–36.0)
MCV: 85.9 fL (ref 78.0–100.0)
Platelets: 259 10*3/uL (ref 150–400)
RBC: 4.75 MIL/uL (ref 4.22–5.81)
RDW: 13.1 % (ref 11.5–15.5)
WBC: 10.7 10*3/uL — ABNORMAL HIGH (ref 4.0–10.5)

## 2016-04-16 MED ORDER — NAPROXEN 250 MG PO TABS
500.0000 mg | ORAL_TABLET | Freq: Once | ORAL | Status: AC
  Administered 2016-04-16: 500 mg via ORAL
  Filled 2016-04-16: qty 2

## 2016-04-16 NOTE — ED Provider Notes (Signed)
[facility]-EMER[facility]Y DEPT Provider Note   CSN: [id number] Arrival date & time: 04/15/16  2256  Time seen 05:50 AM   History   Chief Complaint Chief Complaint  Patient presents with  . Chest Pain    HPI Ronald Gray is a 21 y.o. male.  HPI  patient states for the past month or so he gets sharp pains in different areas of his chest, he states it's a stabbing pain and it can last up to all day long. He states sometimes coughing makes the pain worse, sleeping makes it feel better. He states he gets the pain about every other day. He states he's had a dry cough for a few days without fever. He denies sore throat, rhinorrhea, vomiting, diarrhea, and states tonight he had some shortness of breath "sometimes". He states tonight the pain started about 8:30 PM. Tonight it is in the center of his chest and is constant but it waxes and wanes. He states it feels like icy hot when it's icy.  He denies any trauma or change in activity. His mother has a history of PVCs, his maternal grandfather had congenital heart disease and a maternal uncle died of congenital heart disease. His maternal grandmother had heart attacks.  Patient states he used to be on Abilify, citalopram, and trazodone for depression. He has been out of his medicine several months ago.  Past Medical History:  Diagnosis Date  . Depression     Patient Active Problem List   Diagnosis Date Noted  . Cannabis use disorder, moderate, dependence (HCC) 09/13/2014  . Severe major depression with psychotic features, mood-congruent (HCC) 09/11/2014    History reviewed. No pertinent surgical history.     Home Medications    Prior to Admission medications   Not on File    Family History History reviewed. No pertinent family history.  Social History Social History  Substance Use Topics  . Smoking status: Never Smoker  . Smokeless tobacco: Never Used  . Alcohol use No  employed Smokes 1 pp week Rare  alcohol   Allergies   Latex   Review of Systems Review of Systems  All other systems reviewed and are negative.    Physical Exam Updated Vital Signs ED Triage Vitals [04/15/16 2321]  Enc Vitals Group     BP (!) 115/97     Pulse Rate 90     Resp 18     Temp 98.1 F (36.7 C)     Temp Source Oral     SpO2 100 %     Weight 267 lb (121.1 kg)     Height 6' (1.829 m)     Head Circumference      Peak Flow      Pain Score 10     Pain Loc      Pain Edu?      Excl. in GC?    Vital signs normal    Physical Exam  Constitutional: He is oriented to person, place, and time. He appears well-developed and well-nourished.  Non-toxic appearance. He does not appear ill. No distress.  HENT:  Head: Normocephalic and atraumatic.  Right Ear: External ear normal.  Left Ear: External ear normal.  Nose: Nose normal. No mucosal edema or rhinorrhea.  Mouth/Throat: Oropharynx is clear and moist and mucous membranes are normal. No dental abscesses or uvula swelling.  Eyes: Conjunctivae and EOM are normal. Pupils are equal, round, and reactive to light.  Neck: Normal range of motion and full passive range  of motion without pain. Neck supple.  Cardiovascular: Normal rate, regular rhythm and normal heart sounds.  Exam reveals no gallop and no friction rub.   No murmur heard. Pulmonary/Chest: Effort normal and breath sounds normal. No respiratory distress. He has no wheezes. He has no rhonchi. He has no rales. He exhibits no tenderness and no crepitus.    Mild tenderness in the lower sternal region  Abdominal: Soft. Normal appearance and bowel sounds are normal. He exhibits no distension. There is no tenderness. There is no rebound and no guarding.  Musculoskeletal: Normal range of motion. He exhibits no edema or tenderness.  Moves all extremities well.   Neurological: He is alert and oriented to person, place, and time. He has normal strength. No cranial nerve deficit.  Skin: Skin is warm, dry  and intact. No rash noted. No erythema. No pallor.  Psychiatric: He has a normal mood and affect. His speech is normal and behavior is normal. His mood appears not anxious.  Nursing note and vitals reviewed.    ED Treatments / Results  Labs (all labs ordered are listed, but only abnormal results are displayed) Results for orders placed or performed during the hospital encounter of 04/16/16  Basic metabolic panel  Result Value Ref Range   Sodium 138 135 - 145 mmol/L   Potassium 4.0 3.5 - 5.1 mmol/L   Chloride 102 101 - 111 mmol/L   CO2 28 22 - 32 mmol/L   Glucose, Bld 106 (H) 65 - 99 mg/dL   BUN 14 6 - 20 mg/dL   Creatinine, Ser 1.61 0.61 - 1.24 mg/dL   Calcium 9.4 8.9 - 09.6 mg/dL   GFR calc non Af Amer >60 >60 mL/min   GFR calc Af Amer >60 >60 mL/min   Anion gap 8 5 - 15  CBC  Result Value Ref Range   WBC 10.7 (H) 4.0 - 10.5 K/uL   RBC 4.75 4.22 - 5.81 MIL/uL   Hemoglobin 14.1 13.0 - 17.0 g/dL   HCT 04.5 40.9 - 81.1 %   MCV 85.9 78.0 - 100.0 fL   MCH 29.7 26.0 - 34.0 pg   MCHC 34.6 30.0 - 36.0 g/dL   RDW 91.4 78.2 - 95.6 %   Platelets 259 150 - 400 K/uL  Troponin I  Result Value Ref Range   Troponin I <0.03 <0.03 ng/mL  Troponin I  Result Value Ref Range   Troponin I <0.03 <0.03 ng/mL   Laboratory interpretation all normal except mild leukocytosis, negative delta troponin    EKG  EKG Interpretation  Date/Time:  Sunday April 15 2016 23:28:38 EDT Ventricular Rate:  95 PR Interval:  142 QRS Duration: 98 QT Interval:  364 QTC Calculation: 457 R Axis:   81 Text Interpretation:  Normal sinus rhythm Normal ECG No significant change since last tracing 13 Sep 2014 Confirmed by Yanely Mast  MD-I, Kyrianna Barletta ([id number]) on 04/15/2016 11:59:02 PM       Radiology Dg Chest 2 View  Result Date: 04/15/2016 CLINICAL DATA:  Cough EXAM: CHEST  2 VIEW COMPARISON:  None FINDINGS: The heart size and mediastinal contours are within normal limits. Both lungs are clear. The visualized skeletal  structures are unremarkable. IMPRESSION: No active cardiopulmonary disease. Electronically Signed   By: Signa Kell M.D.   On: 04/15/2016 23:44    Procedures Procedures (including critical care time)  Medications Ordered in ED Medications  naproxen (NAPROSYN) tablet 500 mg (500 mg Oral Given 04/16/16 0629)     Initial  Impression / Assessment and Plan / ED Course  I have reviewed the triage vital signs and the nursing notes.  Pertinent labs & imaging results that were available during my care of the patient were reviewed by me and considered in my medical decision making (see chart for details).   Patient was given naproxen for his pain. His first troponin was about a 3 hour blood draw after his pain started. A second troponin was ordered at 6 AM which would make this a 9-1/2 hour after pain started troponin. We discussed if this is negative that is very reassuring and he is not having an acute cardiac event. His EKG also appears normal. He was given naproxen for pain.  Recheck at 7:15 AM he states his pain is improving but not gone. We discussed his test results were normal. This is most likely a chest pain that is common in young people but without significant underlying pathology such as pneumonia, heart attack, or other serious medical condition.  Final Clinical Impressions(s) / ED Diagnoses   Final diagnoses:  Chest wall pain    New Prescriptions OTC ibuprofen  Plan discharge  Devoria Albe, MD, Concha Pyo, MD 04/16/16 [phone]

## 2016-04-16 NOTE — ED Notes (Signed)
ED Provider at bedside. 

## 2016-04-16 NOTE — Discharge Instructions (Signed)
Use ice and heat for comfort. Take ibuprofen 600 mg every 6 hrs for pain as needed.  Recheck if you seem worse such as fever, struggling to breathe or worsening chest pain.

## 2016-06-09 ENCOUNTER — Emergency Department (HOSPITAL_COMMUNITY): Payer: Self-pay

## 2016-06-09 ENCOUNTER — Inpatient Hospital Stay (HOSPITAL_COMMUNITY)
Admission: EM | Admit: 2016-06-09 | Discharge: 2016-06-12 | DRG: 091 | Disposition: A | Discharge status: Home / Self Care | Payer: Self-pay | Attending: Pulmonary Disease | Admitting: Pulmonary Disease

## 2016-06-09 ENCOUNTER — Encounter (HOSPITAL_COMMUNITY): Payer: Self-pay | Admitting: *Deleted

## 2016-06-09 DIAGNOSIS — E876 Hypokalemia: Secondary | ICD-10-CM | POA: Diagnosis present

## 2016-06-09 DIAGNOSIS — R451 Restlessness and agitation: Secondary | ICD-10-CM

## 2016-06-09 DIAGNOSIS — G92 Toxic encephalopathy: Principal | ICD-10-CM | POA: Diagnosis present

## 2016-06-09 DIAGNOSIS — R7989 Other specified abnormal findings of blood chemistry: Secondary | ICD-10-CM

## 2016-06-09 DIAGNOSIS — R9431 Abnormal electrocardiogram [ECG] [EKG]: Secondary | ICD-10-CM

## 2016-06-09 DIAGNOSIS — R739 Hyperglycemia, unspecified: Secondary | ICD-10-CM | POA: Diagnosis present

## 2016-06-09 DIAGNOSIS — Z91040 Latex allergy status: Secondary | ICD-10-CM

## 2016-06-09 DIAGNOSIS — Z789 Other specified health status: Secondary | ICD-10-CM

## 2016-06-09 DIAGNOSIS — J9600 Acute respiratory failure, unspecified whether with hypoxia or hypercapnia: Secondary | ICD-10-CM | POA: Diagnosis present

## 2016-06-09 LAB — COMPREHENSIVE METABOLIC PANEL
ALBUMIN: 4.2 g/dL (ref 3.5–5.0)
ALK PHOS: 60 U/L (ref 38–126)
ALT: 19 U/L (ref 17–63)
AST: 31 U/L (ref 15–41)
Anion gap: 13 (ref 5–15)
BUN: 18 mg/dL (ref 6–20)
CALCIUM: 8.9 mg/dL (ref 8.9–10.3)
CO2: 23 mmol/L (ref 22–32)
CREATININE: 0.71 mg/dL (ref 0.61–1.24)
Chloride: 106 mmol/L (ref 101–111)
GFR calc Af Amer: >60 mL/min (ref >60)
GFR calc non Af Amer: >60 mL/min (ref >60)
Glucose, Bld: 179 mg/dL — ABNORMAL HIGH (ref 65–99)
Potassium: 2.5 mmol/L — CL (ref 3.5–5.1)
SODIUM: 142 mmol/L (ref 135–145)
Total Bilirubin: 0.4 mg/dL (ref 0.3–1.2)
Total Protein: 7 g/dL (ref 6.5–8.1)

## 2016-06-09 LAB — CBC WITH DIFFERENTIAL/PLATELET
BASOS PCT: 0 %
Basophils Absolute: 0 10*3/uL (ref 0.0–0.1)
EOS ABS: 0.2 10*3/uL (ref 0.0–0.7)
Eosinophils Relative: 2 %
HCT: 38.3 % — ABNORMAL LOW (ref 39.0–52.0)
Hemoglobin: 13.2 g/dL (ref 13.0–17.0)
Lymphocytes Relative: 51 %
Lymphs Abs: 4.3 10*3/uL — ABNORMAL HIGH (ref 0.7–4.0)
MCH: 29.4 pg (ref 26.0–34.0)
MCHC: 34.5 g/dL (ref 30.0–36.0)
MCV: 85.3 fL (ref 78.0–100.0)
MONO ABS: 0.5 10*3/uL (ref 0.1–1.0)
MONOS PCT: 5 %
Neutro Abs: 3.5 10*3/uL (ref 1.7–7.7)
Neutrophils Relative %: 42 %
Platelets: 206 10*3/uL (ref 150–400)
RBC: 4.49 MIL/uL (ref 4.22–5.81)
RDW: 12.8 % (ref 11.5–15.5)
WBC: 8.5 10*3/uL (ref 4.0–10.5)

## 2016-06-09 LAB — ACETAMINOPHEN LEVEL: Acetaminophen (Tylenol), Serum: <10 ug/mL — ABNORMAL LOW (ref 10–30)

## 2016-06-09 LAB — SALICYLATE LEVEL: Salicylate Lvl: <7 mg/dL (ref 2.8–30.0)

## 2016-06-09 LAB — ETHANOL: Alcohol, Ethyl (B): <5 mg/dL (ref <5)

## 2016-06-09 LAB — LACTIC ACID, PLASMA: Lactic Acid, Venous: 4.2 mmol/L (ref 0.5–1.9)

## 2016-06-09 MED ORDER — MAGNESIUM SULFATE 2 GM/50ML IV SOLN
2.0000 g | Freq: Once | INTRAVENOUS | Status: AC
  Administered 2016-06-09: 2 g via INTRAVENOUS
  Filled 2016-06-09: qty 50

## 2016-06-09 MED ORDER — PROPOFOL 1000 MG/100ML IV EMUL
5.0000 ug/kg/min | Freq: Once | INTRAVENOUS | Status: AC
  Administered 2016-06-09: 10 ug/kg/min via INTRAVENOUS

## 2016-06-09 MED ORDER — PROPOFOL 1000 MG/100ML IV EMUL
INTRAVENOUS | Status: AC
  Filled 2016-06-09: qty 100

## 2016-06-09 MED ORDER — ROCURONIUM BROMIDE 50 MG/5ML IV SOLN
100.0000 mg | Freq: Once | INTRAVENOUS | Status: AC
  Administered 2016-06-09: 100 mg via INTRAVENOUS

## 2016-06-09 MED ORDER — LORAZEPAM 2 MG/ML IJ SOLN
2.0000 mg | Freq: Once | INTRAMUSCULAR | Status: AC
  Administered 2016-06-09: 2 mg via INTRAVENOUS

## 2016-06-09 MED ORDER — ETOMIDATE 2 MG/ML IV SOLN
20.0000 mg | Freq: Once | INTRAVENOUS | Status: AC
  Administered 2016-06-09: 20 mg via INTRAVENOUS

## 2016-06-09 MED ORDER — PROPOFOL 1000 MG/100ML IV EMUL: 5.0000 ug/kg/min | INTRAVENOUS | Status: DC

## 2016-06-09 MED ORDER — ROCURONIUM BROMIDE 50 MG/5ML IV SOLN: 80.0000 mg | Freq: Once | INTRAVENOUS | Status: DC

## 2016-06-09 NOTE — ED Provider Notes (Signed)
[facility] DEPT Provider Note   CSN: [id number] Arrival date & time: 06/09/16  2310     History   Chief Complaint Chief Complaint  Patient presents with  . Altered Mental Status    HPI Ronald Gray is a 21 y.o. male.  The history is provided by the EMS personnel. The history is limited by the condition of the patient (Altered mental status).  He was brought in by EMS because of altered mental status. He is reported to have smoked some marijuana earlier this evening and, possibly, took some Ambien. EMS noted that he was very agitated. A friend who supposedly smoked the same marijuana had no problems. EMS reports pinpoint pupils and gave 2 doses of naloxone. Volume is, patient became extremely agitated. The guide to sedate him with haloperidol 10 mg without benefit. Also noted was severe tachycardia with heart rate over 180. He was given adenosine 6 mg and, then, edematous and 12 mg with no benefit. Patient is unable to give any history whatsoever.  Past Medical History:  Diagnosis Date  . Depression     Patient Active Problem List   Diagnosis Date Noted  . Cannabis use disorder, moderate, dependence ([place]) 09/13/2014  . Severe major depression with psychotic features, mood-congruent ([place]) 09/11/2014    History reviewed. No pertinent surgical history.     Home Medications    Prior to Admission medications   Not on File    Family History History reviewed. No pertinent family history.  Social History Social History  Substance Use Topics  . Smoking status: Never Smoker  . Smokeless tobacco: Never Used  . Alcohol use No     Allergies   Latex   Review of Systems Review of Systems  Unable to perform ROS: Mental status change     Physical Exam Updated Vital Signs Ht 6' 2"  (1.88 m)   Wt 121.1 kg (267 lb)   BMI 34.28 kg/m   Physical Exam  Nursing note and vitals reviewed.  Agitated 21 year old male, resting comfortably and in no acute  distress. Vital signs are significant for tachycardia. Oxygen saturation is 100%, which is normal. Head is normocephalic and atraumatic. Pupils are 3 mm and minimally reactive, EOMI. Oropharynx is clear. Neck is nontender and supple without adenopathy or JVD. Back is nontender and there is no CVA tenderness. Lungs are clear without rales, wheezes, or rhonchi. Chest is nontender. Heart is tachycardic without murmur. Abdomen is soft, flat, nontender without masses or hepatosplenomegaly and peristalsis is normoactive. Extremities have no cyanosis or edema, full range of motion is present. Skin is warm and diaphoretic without rash. Neurologic: He is awake but extremely agitated and nonverbal, does not follow commands, cranial nerves are intact. He moves all extremities equally.  ED Treatments / Results  Labs (all labs ordered are listed, but only abnormal results are displayed) Labs Reviewed  ACETAMINOPHEN LEVEL - Abnormal; Notable for the following:       Result Value   Acetaminophen (Tylenol), Serum <10 (*)    All other components within normal limits  COMPREHENSIVE METABOLIC PANEL - Abnormal; Notable for the following:    Potassium 2.5 (*)    Glucose, Bld 179 (*)    All other components within normal limits  CBC WITH DIFFERENTIAL/PLATELET - Abnormal; Notable for the following:    HCT 38.3 (*)    Lymphs Abs 4.3 (*)    All other components within normal limits  LACTIC ACID, PLASMA - Abnormal; Notable for the following:  Lactic Acid, Venous 4.2 (*)    All other components within normal limits  RAPID URINE DRUG SCREEN, HOSP PERFORMED - Abnormal; Notable for the following:    Tetrahydrocannabinol POSITIVE (*)    All other components within normal limits  MAGNESIUM - Abnormal; Notable for the following:    Magnesium 1.6 (*)    All other components within normal limits  BLOOD GAS, ARTERIAL - Abnormal; Notable for the following:    pO2, Arterial 139 (*)    All other components within  normal limits  URINE CULTURE  SALICYLATE LEVEL  ETHANOL  URINALYSIS, ROUTINE W REFLEX MICROSCOPIC  LACTIC ACID, PLASMA    EKG  EKG Interpretation  Date/Time:  Saturday Jun 09 2016 23:32:50 EDT Ventricular Rate:  140 PR Interval:    QRS Duration: 105 QT Interval:  403 QTC Calculation: 616 R Axis:   99 Text Interpretation:  Sinus tachycardia Borderline right axis deviation Minimal ST depression, diffuse leads Prolonged QT interval When compared with ECG of 04/15/2016, QT has lengthened HEART RATE has increased Confirmed by Delora Fuel ([id number]) on 06/09/2016 11:36:41 PM       Radiology Ct Head Wo Contrast  Result Date: 06/10/2016 CLINICAL DATA:  Altered mental status. Patient was found unresponsive. History of substance use tonight. EXAM: CT HEAD WITHOUT CONTRAST TECHNIQUE: Contiguous axial images were obtained from the base of the skull through the vertex without intravenous contrast. COMPARISON:  None. FINDINGS: Brain: Circumscribed low dense structure in the right internal capsule region measuring 11 mm diameter. This could represent a lacunar infarct but small cystic lesion not excluded. Consider follow-up with MRI for further evaluation. No mass effect or midline shift. No abnormal extra-axial fluid collections. Gray-white matter junctions are distinct. Basal cisterns are not effaced. No evidence of acute intracranial hemorrhage. Vascular: No hyperdense vessel or unexpected calcification. Skull: Calvarium appears intact. Sinuses/Orbits: Mild mucosal thickening in the paranasal sinuses. No acute air-fluid levels. Mastoid air cells are not opacified. Other: None. IMPRESSION: No acute intracranial abnormalities demonstrated. No acute intracranial hemorrhage. Nonspecific low-attenuation lesion in the right internal capsule could represent cyst or lacunar infarct. Consider follow-up MRI for further evaluation. Electronically Signed   By: Lucienne Capers M.D.   On: 06/10/2016 00:50   Dg Chest  Port 1 View  Result Date: 06/09/2016 CLINICAL DATA:  Patient became unresponsive. Altered mental status. Initial encounter. EXAM: PORTABLE CHEST 1 VIEW COMPARISON:  Chest radiograph performed 04/15/2016 FINDINGS: The lungs are well-aerated. Mild retrocardiac opacity may reflect atelectasis or possibly mild infection. There is no evidence of pleural effusion or pneumothorax. The cardiomediastinal silhouette is within normal limits. No acute osseous abnormalities are seen. An enteric tube is noted extending below the diaphragm. IMPRESSION: Mild retrocardiac opacity may reflect atelectasis or possibly mild infection. Would correlate with the patient's symptoms. Electronically Signed   By: Garald Balding M.D.   On: 06/09/2016 23:55    Procedures Procedure Name: Intubation Date/Time: 06/09/2016 11:30 PM Performed by: Roxanne Mins, Elia Nunley Pre-anesthesia Checklist: Patient identified, Patient being monitored, Timeout performed, Emergency Drugs available and Suction available Oxygen Delivery Method: Non-rebreather mask Preoxygenation: Pre-oxygenation with 100% oxygen Intubation Type: IV induction and Rapid sequence Ventilation: Mask ventilation without difficulty Laryngoscope Size: Mac, Glidescope and 4 Grade View: Grade I Tube size: 8.0 mm Number of attempts: 1 Airway Equipment and Method: Video-laryngoscopy and Stylet Placement Confirmation: ETT inserted through vocal cords under direct vision,  Positive ETCO2,  CO2 detector and Breath sounds checked- equal and bilateral Secured at: 25 cm Tube secured with:  ETT holder Dental Injury: Teeth and Oropharynx as per pre-operative assessment        (including critical care time) CRITICAL CARE Performed by: ZOXWR,UEAVW Total critical care time: 90 minutes Critical care time was exclusive of separately billable procedures and treating other patients. Critical care was necessary to treat or prevent imminent or life-threatening deterioration. Critical care  was time spent personally by me on the following activities: development of treatment plan with patient and/or surrogate as well as nursing, discussions with consultants, evaluation of patient's response to treatment, examination of patient, obtaining history from patient or surrogate, ordering and performing treatments and interventions, ordering and review of laboratory studies, ordering and review of radiographic studies, pulse oximetry and re-evaluation of patient's condition.  Medications Ordered in ED Medications  potassium chloride 10 mEq in 100 mL IVPB (10 mEq Intravenous New Bag/Given 06/10/16 0122)  potassium chloride 10 mEq in 100 mL IVPB (not administered)  etomidate (AMIDATE) injection 20 mg (20 mg Intravenous Given 06/09/16 2315)  propofol (DIPRIVAN) 1000 MG/100ML infusion (5 mcg/kg/min  121.1 kg Intravenous Rate/Dose Change 06/10/16 0032)  LORazepam (ATIVAN) injection 2 mg (2 mg Intravenous Given 06/09/16 2309)  rocuronium (ZEMURON) injection 100 mg (100 mg Intravenous Given 06/09/16 2317)  magnesium sulfate IVPB 2 g 50 mL (0 g Intravenous Stopped 06/10/16 0027)  potassium chloride 10 mEq in 100 mL IVPB (0 mEq Intravenous Stopped 06/10/16 0122)  sodium chloride 0.9 % bolus 1,000 mL (0 mLs Intravenous Stopped 06/10/16 0057)  sodium chloride 0.9 % bolus 1,000 mL (1,000 mLs Intravenous New Bag/Given 06/10/16 0117)     Initial Impression / Assessment and Plan / ED Course  I have reviewed the triage vital signs and the nursing notes.  Pertinent labs & imaging results that were available during my care of the patient were reviewed by me and considered in my medical decision making (see chart for details).  Patient arrived exceedingly agitated in spite of having received haloperidol and reviewed. He is given lorazepam 2 mg with only minimal improvement. He required fou-point restraints and still needed to be physically restrained beyond that. For protection of both patient and staff, decision  was made to sedate him and intubate him. Following intubation, heart rate did drop to 143. Old records are reviewed, and he does have a prior ED visit for auditory hallucinations.  12:20 AM Potassium is come back critically low at 2.5 and intravenous potassium is initiated. ECG shows prolonged QT interval and magnesium is low at 1.6, and he is given intravenous magnesium. Lactic acid level is elevated to 4.2. This is not from sepsis, but due to his agitation and fighting with care providers. Family has arrived and states that he had worked today and was normal when he came home. He took a shower and went to bed but then woke up and went out and smokes marijuana with a friend. He was noted to me having some difficulty speaking and seemed to be frustrated with this. He then staggered outside. This is when EMS was called. Family states that, as far as they know, he does not use any drugs other than marijuana.  Cases discussed with Dr. Corrie Dandy of critical care service at [facility] who agrees to accept the patient in transfer. He requests additional fluid be given and also check ABG. Family has been advised of need for transfer.  Final Clinical Impressions(s) / ED Diagnoses   Final diagnoses:  Agitation  Hypokalemia  Hypomagnesemia  Prolonged QT interval  Elevated  lactic acid level    New Prescriptions New Prescriptions   No medications on file     Delora Fuel, MD 63/14/97 [phone]

## 2016-06-09 NOTE — ED Triage Notes (Signed)
Pt brought in by [facility] due to combative state; friend called when pt became unresponsive on scene; upon ems arrival pt was given narcan 2mg  iv with no response, pt was given 2mg  narcan and pt then became combative; pt was given haldol 10mg  iv on scene by ems and brought to hospital; friend told ems that pt had smoked some marijuana and drank a beer tonight; upon arrival to ed, pt very combative and agitated and unable to answer any questions

## 2016-06-10 ENCOUNTER — Inpatient Hospital Stay (HOSPITAL_COMMUNITY): Payer: Self-pay

## 2016-06-10 DIAGNOSIS — R451 Restlessness and agitation: Secondary | ICD-10-CM | POA: Diagnosis present

## 2016-06-10 DIAGNOSIS — J988 Other specified respiratory disorders: Secondary | ICD-10-CM

## 2016-06-10 LAB — GLUCOSE, CAPILLARY
GLUCOSE-CAPILLARY: 77 mg/dL (ref 65–99)
GLUCOSE-CAPILLARY: 77 mg/dL (ref 65–99)
GLUCOSE-CAPILLARY: 87 mg/dL (ref 65–99)
Glucose-Capillary: 93 mg/dL (ref 65–99)
Glucose-Capillary: 90 mg/dL (ref 65–99)
Glucose-Capillary: 80 mg/dL (ref 65–99)

## 2016-06-10 LAB — BLOOD GAS, ARTERIAL
ACID-BASE EXCESS: 0.8 mmol/L (ref 0.0–2.0)
Acid-base deficit: 1.8 mmol/L (ref 0.0–2.0)
BICARBONATE: 22.2 mmol/L (ref 20.0–28.0)
Bicarbonate: 24.1 mmol/L (ref 20.0–28.0)
DRAWN BY: 317771
Drawn by: 23604
FIO2: 0.4
FIO2: 40
LHR: 16 {breaths}/min
MECHVT: 600 mL
O2 SAT: 96.6 %
O2 Saturation: 98.8 %
PATIENT TEMPERATURE: 97.9
PCO2 ART: 35.5 mmHg (ref 32.0–48.0)
PEEP: 5 cmH2O
PEEP: 5 cmH2O
PH ART: 7.442 (ref 7.350–7.450)
PH ART: 7.41 (ref 7.350–7.450)
PO2 ART: 139 mmHg — AB (ref 83.0–108.0)
RATE: 18 resp/min
VT: 660 mL
pCO2 arterial: 33.9 mmHg (ref 32.0–48.0)
pO2, Arterial: 86.7 mmHg (ref 83.0–108.0)

## 2016-06-10 LAB — BASIC METABOLIC PANEL
Anion gap: 7 (ref 5–15)
BUN: 11 mg/dL (ref 6–20)
CHLORIDE: 113 mmol/L — AB (ref 101–111)
CO2: 22 mmol/L (ref 22–32)
Calcium: 8.1 mg/dL — ABNORMAL LOW (ref 8.9–10.3)
Creatinine, Ser: 0.69 mg/dL (ref 0.61–1.24)
GFR calc Af Amer: >60 mL/min (ref >60)
GFR calc non Af Amer: >60 mL/min (ref >60)
GLUCOSE: 91 mg/dL (ref 65–99)
POTASSIUM: 3.5 mmol/L (ref 3.5–5.1)
Sodium: 142 mmol/L (ref 135–145)

## 2016-06-10 LAB — URINALYSIS, ROUTINE W REFLEX MICROSCOPIC
Bilirubin Urine: NEGATIVE
GLUCOSE, UA: NEGATIVE mg/dL
HGB URINE DIPSTICK: NEGATIVE
Ketones, ur: NEGATIVE mg/dL
Leukocytes, UA: NEGATIVE
Nitrite: NEGATIVE
PROTEIN: NEGATIVE mg/dL
Specific Gravity, Urine: 1.014 (ref 1.005–1.030)
pH: 6 (ref 5.0–8.0)

## 2016-06-10 LAB — TRIGLYCERIDES
TRIGLYCERIDES: 97 mg/dL (ref <150)
Triglycerides: 101 mg/dL (ref <150)

## 2016-06-10 LAB — MRSA PCR SCREENING: MRSA by PCR: POSITIVE — AB

## 2016-06-10 LAB — RAPID URINE DRUG SCREEN, HOSP PERFORMED
Amphetamines: NOT DETECTED
BARBITURATES: NOT DETECTED
BENZODIAZEPINES: NOT DETECTED
COCAINE: NOT DETECTED
Opiates: NOT DETECTED
Tetrahydrocannabinol: POSITIVE — AB

## 2016-06-10 LAB — CBC
HCT: 35.2 % — ABNORMAL LOW (ref 39.0–52.0)
HEMOGLOBIN: 11.8 g/dL — AB (ref 13.0–17.0)
MCH: 28.6 pg (ref 26.0–34.0)
MCHC: 33.5 g/dL (ref 30.0–36.0)
MCV: 85.2 fL (ref 78.0–100.0)
PLATELETS: 222 10*3/uL (ref 150–400)
RBC: 4.13 MIL/uL — AB (ref 4.22–5.81)
RDW: 13.1 % (ref 11.5–15.5)
WBC: 12.6 10*3/uL — ABNORMAL HIGH (ref 4.0–10.5)

## 2016-06-10 LAB — MAGNESIUM: Magnesium: 1.6 mg/dL — ABNORMAL LOW (ref 1.7–2.4)

## 2016-06-10 LAB — LACTIC ACID, PLASMA: Lactic Acid, Venous: 2 mmol/L (ref 0.5–1.9)

## 2016-06-10 MED ORDER — INSULIN ASPART 100 UNIT/ML ~~LOC~~ SOLN: 2.0000 [IU] | SUBCUTANEOUS | Status: DC

## 2016-06-10 MED ORDER — CHLORHEXIDINE GLUCONATE 0.12% ORAL RINSE (MEDLINE KIT): 15.0000 mL | Freq: Two times a day (BID) | OROMUCOSAL | Status: DC

## 2016-06-10 MED ORDER — FENTANYL 2500MCG IN NS 250ML (10MCG/ML) PREMIX INFUSION
25.0000 ug/h | INTRAVENOUS | Status: DC
  Administered 2016-06-10: 100 ug/h via INTRAVENOUS
  Filled 2016-06-10: qty 250

## 2016-06-10 MED ORDER — FENTANYL 2500MCG IN NS 250ML (10MCG/ML) PREMIX INFUSION: 25.0000 ug/h | INTRAVENOUS | Status: DC

## 2016-06-10 MED ORDER — PROPOFOL 1000 MG/100ML IV EMUL
0.0000 ug/kg/min | INTRAVENOUS | Status: DC
  Administered 2016-06-10: 50 ug/kg/min via INTRAVENOUS

## 2016-06-10 MED ORDER — ENOXAPARIN SODIUM 40 MG/0.4ML ~~LOC~~ SOLN
40.0000 mg | SUBCUTANEOUS | Status: DC
  Administered 2016-06-10 – 2016-06-11 (×2): 40 mg via SUBCUTANEOUS
  Filled 2016-06-10 (×2): qty 0.4

## 2016-06-10 MED ORDER — BISACODYL 10 MG RE SUPP: 10.0000 mg | Freq: Every day | RECTAL | Status: DC | PRN

## 2016-06-10 MED ORDER — DOCUSATE SODIUM 50 MG/5ML PO LIQD: 100.0000 mg | Freq: Two times a day (BID) | ORAL | Status: DC | PRN

## 2016-06-10 MED ORDER — CHLORHEXIDINE GLUCONATE 0.12% ORAL RINSE (MEDLINE KIT)
15.0000 mL | Freq: Two times a day (BID) | OROMUCOSAL | Status: DC
  Administered 2016-06-10 – 2016-06-11 (×3): 15 mL via OROMUCOSAL

## 2016-06-10 MED ORDER — POTASSIUM CHLORIDE 10 MEQ/100ML IV SOLN
10.0000 meq | Freq: Once | INTRAVENOUS | Status: DC
  Filled 2016-06-10: qty 100

## 2016-06-10 MED ORDER — ORAL CARE MOUTH RINSE
15.0000 mL | Freq: Four times a day (QID) | OROMUCOSAL | Status: DC
  Administered 2016-06-10 – 2016-06-11 (×5): 15 mL via OROMUCOSAL

## 2016-06-10 MED ORDER — FENTANYL CITRATE (PF) 100 MCG/2ML IJ SOLN: 100.0000 ug | INTRAMUSCULAR | Status: DC | PRN

## 2016-06-10 MED ORDER — MUPIROCIN 2 % EX OINT
1.0000 "application " | TOPICAL_OINTMENT | Freq: Two times a day (BID) | CUTANEOUS | Status: DC
  Administered 2016-06-10 – 2016-06-11 (×3): 1 via NASAL
  Filled 2016-06-10: qty 22

## 2016-06-10 MED ORDER — PROPOFOL 1000 MG/100ML IV EMUL
INTRAVENOUS | Status: AC
  Filled 2016-06-10: qty 100

## 2016-06-10 MED ORDER — PROPOFOL 1000 MG/100ML IV EMUL: 0.0000 ug/kg/min | INTRAVENOUS | Status: DC

## 2016-06-10 MED ORDER — FENTANYL CITRATE (PF) 100 MCG/2ML IJ SOLN: 50.0000 ug | Freq: Once | INTRAMUSCULAR | Status: DC

## 2016-06-10 MED ORDER — PANTOPRAZOLE SODIUM 40 MG IV SOLR
40.0000 mg | Freq: Every day | INTRAVENOUS | Status: DC
  Administered 2016-06-10 – 2016-06-11 (×2): 40 mg via INTRAVENOUS
  Filled 2016-06-10 (×2): qty 40

## 2016-06-10 MED ORDER — LORAZEPAM 2 MG/ML IJ SOLN
INTRAMUSCULAR | Status: AC
  Administered 2016-06-10: 02:00:00
  Filled 2016-06-10: qty 1

## 2016-06-10 MED ORDER — ORAL CARE MOUTH RINSE: 15.0000 mL | OROMUCOSAL | Status: DC

## 2016-06-10 MED ORDER — SODIUM CHLORIDE 0.9 % IV SOLN
INTRAVENOUS | Status: DC
  Administered 2016-06-10: 11:00:00 via INTRAVENOUS
  Administered 2016-06-11: 50 mL/h via INTRAVENOUS

## 2016-06-10 MED ORDER — MIDAZOLAM HCL 2 MG/2ML IJ SOLN: 2.0000 mg | INTRAMUSCULAR | Status: DC | PRN

## 2016-06-10 MED ORDER — POTASSIUM CHLORIDE 10 MEQ/100ML IV SOLN
10.0000 meq | Freq: Once | INTRAVENOUS | Status: AC
  Administered 2016-06-10: 10 meq via INTRAVENOUS
  Filled 2016-06-10: qty 100

## 2016-06-10 MED ORDER — ROCURONIUM BROMIDE 50 MG/5ML IV SOLN
100.0000 mg | Freq: Once | INTRAVENOUS | Status: AC
  Administered 2016-06-10: 100 mg via INTRAVENOUS
  Filled 2016-06-10: qty 10

## 2016-06-10 MED ORDER — POTASSIUM CHLORIDE 10 MEQ/100ML IV SOLN
10.0000 meq | Freq: Once | INTRAVENOUS | Status: AC
  Administered 2016-06-10: 10 meq via INTRAVENOUS

## 2016-06-10 MED ORDER — CHLORHEXIDINE GLUCONATE CLOTH 2 % EX PADS
6.0000 | MEDICATED_PAD | Freq: Every day | CUTANEOUS | Status: DC
  Administered 2016-06-10 – 2016-06-12 (×3): 6 via TOPICAL

## 2016-06-10 MED ORDER — LORAZEPAM 2 MG/ML IJ SOLN
2.0000 mg | Freq: Once | INTRAMUSCULAR | Status: AC
  Administered 2016-06-10: 2 mg via INTRAVENOUS

## 2016-06-10 MED ORDER — SODIUM CHLORIDE 0.9 % IV BOLUS (SEPSIS)
1000.0000 mL | Freq: Once | INTRAVENOUS | Status: AC
  Administered 2016-06-10: 1000 mL via INTRAVENOUS

## 2016-06-10 MED ORDER — PROPOFOL 1000 MG/100ML IV EMUL: 5.0000 ug/kg/min | Freq: Once | INTRAVENOUS | Status: DC

## 2016-06-10 MED ORDER — FENTANYL BOLUS VIA INFUSION
50.0000 ug | INTRAVENOUS | Status: DC | PRN
  Filled 2016-06-10: qty 50

## 2016-06-10 NOTE — ED Notes (Signed)
Pt arrived by EMS restrained in handcuffs & ankle shackles. Redness & abrasions noted to both wrist and ankles.

## 2016-06-10 NOTE — ED Notes (Signed)
CRITICAL VALUE ALERT  Critical Value:Potassium 2.5 Date & Time Notied:  06/10/16 @ 1200am Provider Notified: Dr. Preston FleetingGlick Orders Received/Actions taken: None yet

## 2016-06-10 NOTE — ED Notes (Signed)
CRITICAL VALUE ALERT  Critical Value: Lactic Acid 4.2 Date & Time Notied: 06/10/16 @ 1202am Provider Notified:Dr Preston FleetingGlick Orders Received/Actions taken: None yet

## 2016-06-10 NOTE — ED Notes (Signed)
CRITICAL VALUE ALERT  Critical Value: Lactic Acid Date & Time Notied: 06/10/16 @ 0338 Provider Notified: Dr. Preston FleetingGlick Orders Received/Actions taken: None yet

## 2016-06-10 NOTE — Progress Notes (Signed)
Positive MRSA nasal swab-placed on contact isolation/protocol initiated. No family at the bedside at this time. Patient sedated and vented.

## 2016-06-10 NOTE — H&P (Signed)
PULMONARY / CRITICAL CARE MEDICINE   Name: Ronald Gray MRN: [id number] DOB: [birth year]    ADMISSION DATE:  06/09/2016     CHIEF COMPLAINT:  Altered mental status requiring intubation for airway protection  HISTORY OF PRESENT ILLNESS:   Ronald Gray is a 21 y/o man with a past medical history of depression who was brought to the [facility] ED with altered mental status.  Ronald Gray was reportedly in his normal state of health this evening.  Ronald Gray then went out with a friend and smoked marijuana.  The friend apparently did not have any adverse effects.  EMS gave him 2 doses of naloxone after which Ronald Gray became very agitated and combative.  Ronald Gray was then given 10mg  haldol with no effect.  Ronald Gray was then intubated.    PAST MEDICAL HISTORY :  Ronald Gray  has a past medical history of Depression.  PAST SURGICAL HISTORY: Ronald Gray  has no past surgical history on file.  Allergies  Allergen Reactions  . Latex Rash    No current facility-administered medications on file prior to encounter.    No current outpatient prescriptions on file prior to encounter.    FAMILY HISTORY:  History of "heart promblems"  SOCIAL HISTORY: Ronald Gray  reports that Ronald Gray has never smoked. Ronald Gray has never used smokeless tobacco. Ronald Gray reports that Ronald Gray uses drugs. Ronald Gray reports that Ronald Gray does not drink alcohol.  REVIEW OF SYSTEMS:   A full review of systems could not be obtained as the patient is intubated and sedated  SUBJECTIVE:  21 y/o man with altered mental status 2/2 ingestion requiring intubation for airway protection.   VITAL SIGNS: BP 123/67   Pulse 100   Temp 97.9 F (36.6 C) (Oral)   Resp 19   Ht 6\' 2"  (1.88 m)   Wt 121.1 kg (267 lb)   SpO2 99%   BMI 34.28 kg/m   HEMODYNAMICS:    VENTILATOR SETTINGS: Vent Mode: PRVC FiO2 (%):  [40 %-45 %] 40 % Set Rate:  [16 bmp-18 bmp] 16 bmp Vt Set:  [600 mL-660 mL] 660 mL PEEP:  [5 cmH20] 5 cmH20 Plateau Pressure:  [14 cmH20] 14 cmH20  INTAKE / OUTPUT: No intake/output data  recorded.  PHYSICAL EXAMINATION: General:  In bed, intubated, sedated Neuro:  sedated HEENT:   PERRL, ETT in place Cardiovascular:  NRRR, no MRG Lungs:  CTAB, no WRR Abdomen:  Soft, NTND, +BS Musculoskeletal:  No joint abnormalities Skin:  Multiple scratches/insetct bites on lower extremities  LABS:  BMET  Recent Labs Lab 06/09/16 2323  NA 142  K 2.5*  CL 106  CO2 23  BUN 18  CREATININE 0.71  GLUCOSE 179*    Electrolytes  Recent Labs Lab 06/09/16 2323  CALCIUM 8.9  MG 1.6*    CBC  Recent Labs Lab 06/09/16 2323  WBC 8.5  HGB 13.2  HCT 38.3*  PLT 206    Coag's No results for input(s): APTT, INR in the last 168 hours.  Sepsis Markers  Recent Labs Lab 06/09/16 2323 06/10/16 0242  LATICACIDVEN 4.2* 2.0*    ABG  Recent Labs Lab 06/10/16 0121  PHART 7.442  PCO2ART 33.9  PO2ART 139*    Liver Enzymes  Recent Labs Lab 06/09/16 2323  AST 31  ALT 19  ALKPHOS 60  BILITOT 0.4  ALBUMIN 4.2    Cardiac Enzymes No results for input(s): TROPONINI, PROBNP in the last 168 hours.  Glucose  Recent Labs Lab 06/10/16 0526  GLUCAP 93    Imaging  Ct Head Wo Contrast  Result Date: 06/10/2016 CLINICAL DATA:  Altered mental status. Patient was found unresponsive. History of substance use tonight. EXAM: CT HEAD WITHOUT CONTRAST TECHNIQUE: Contiguous axial images were obtained from the base of the skull through the vertex without intravenous contrast. COMPARISON:  None. FINDINGS: Brain: Circumscribed low dense structure in the right internal capsule region measuring 11 mm diameter. This could represent a lacunar infarct but small cystic lesion not excluded. Consider follow-up with MRI for further evaluation. No mass effect or midline shift. No abnormal extra-axial fluid collections. Gray-white matter junctions are distinct. Basal cisterns are not effaced. No evidence of acute intracranial hemorrhage. Vascular: No hyperdense vessel or unexpected  calcification. Skull: Calvarium appears intact. Sinuses/Orbits: Mild mucosal thickening in the paranasal sinuses. No acute air-fluid levels. Mastoid air cells are not opacified. Other: None. IMPRESSION: No acute intracranial abnormalities demonstrated. No acute intracranial hemorrhage. Nonspecific low-attenuation lesion in the right internal capsule could represent cyst or lacunar infarct. Consider follow-up MRI for further evaluation. Electronically Signed   By: Burman Nieves M.D.   On: 06/10/2016 00:50   Dg Chest Port 1 View  Result Date: 06/09/2016 CLINICAL DATA:  Patient became unresponsive. Altered mental status. Initial encounter. EXAM: PORTABLE CHEST 1 VIEW COMPARISON:  Chest radiograph performed 04/15/2016 FINDINGS: The lungs are well-aerated. Mild retrocardiac opacity may reflect atelectasis or possibly mild infection. There is no evidence of pleural effusion or pneumothorax. The cardiomediastinal silhouette is within normal limits. No acute osseous abnormalities are seen. An enteric tube is noted extending below the diaphragm. IMPRESSION: Mild retrocardiac opacity may reflect atelectasis or possibly mild infection. Would correlate with the patient's symptoms. Electronically Signed   By: Roanna Raider M.D.   On: 06/09/2016 23:55     STUDIES:  Head CT 5/26 --> no acute findings  CULTURES: UA 5/26  ANTIBIOTICS: none  SIGNIFICANT EVENTS: Intubated 5/26  LINES/TUBES: ETT 5/26  DISCUSSION: 21 y/o man with history of depression admitted after Ronald Gray was intubated for airway protection.  ASSESSMENT / PLAN:  PULMONARY A: Airway protection P:   Lung protective ventilation VAP bundle SUP Daily SBT Daily awakening trial  CARDIOVASCULAR A:  SVT, prolonged QT P:  Likely 2/2 hypokalemia Avoid QT prolonging agents Replete K and Mag   ENDOCRINE A:   Hyperglycemia   P:   SSI  NEUROLOGIC A:   sedated P:   RASS goal: 0 Daily awakening   FAMILY  - Updates: family at  bedside - updated  - Inter-disciplinary family meet or Palliative Care meeting due by:  6/2   Sandi Carne, MD, PhD Pulmonary and Critical Care Medicine [facility] Pager: [phone]  06/10/2016, 5:42 AM

## 2016-06-10 NOTE — ED Notes (Signed)
 10mg  bolus of propofol given d/t pt moving.

## 2016-06-10 NOTE — ED Notes (Signed)
20mg bolus of propofol given.

## 2016-06-10 NOTE — Progress Notes (Signed)
. PCCM Progress Note  Admission date: 06/09/2016 Referring provider: Dr. Preston FleetingGlick, ER  CC: altered mental status  HPI: 21 yo male brought to ER combative after smoking marijuana and then became unresponsive after getting haldol.  Intubated for airway protection.    Subjective: Sedated.  Vital signs: BP (!) 101/56   Pulse 79   Temp 98 F (36.7 C) (Oral)   Resp 16   Ht 6\' 2"  (1.88 m)   Wt 267 lb (121.1 kg)   SpO2 100%   BMI 34.28 kg/m   Intake/output: I/O last 3 completed shifts: In: 4414.9 [I.V.:2064.9; IV Piggyback:2350] Out: 2350 [Urine:2350]  General: sedated Neuro: RASS -4 HEENT: pupils pinpoint, ETT in place Cardiac: regular, no murmur Chest: no wheeze Abd: soft, non tender Ext: no edema Skin: no rashes   CMP Latest Ref Rng & Units 06/10/2016 06/09/2016 04/16/2016  Glucose 65 - 99 mg/dL 91 161(W179(H) 960(A106(H)  BUN 6 - 20 mg/dL 11 18 14   Creatinine 0.61 - 1.24 mg/dL 5.400.69 9.810.71 1.910.70  Sodium 135 - 145 mmol/L 142 142 138  Potassium 3.5 - 5.1 mmol/L 3.5 2.5(LL) 4.0  Chloride 101 - 111 mmol/L 113(H) 106 102  CO2 22 - 32 mmol/L 22 23 28   Calcium 8.9 - 10.3 mg/dL 8.1(L) 8.9 9.4  Total Protein 6.5 - 8.1 g/dL - 7.0 -  Total Bilirubin 0.3 - 1.2 mg/dL - 0.4 -  Alkaline Phos 38 - 126 U/L - 60 -  AST 15 - 41 U/L - 31 -  ALT 17 - 63 U/L - 19 -     CBC Latest Ref Rng & Units 06/10/2016 06/09/2016 04/16/2016  WBC 4.0 - 10.5 K/uL 12.6(H) 8.5 10.7(H)  Hemoglobin 13.0 - 17.0 g/dL 11.8(L) 13.2 14.1  Hematocrit 39.0 - 52.0 % 35.2(L) 38.3(L) 40.8  Platelets 150 - 400 K/uL 222 206 259     ABG    Component Value Date/Time   PHART 7.410 06/10/2016 0540   PCO2ART 35.5 06/10/2016 0540   PO2ART 86.7 06/10/2016 0540   HCO3 22.2 06/10/2016 0540   ACIDBASEDEF 1.8 06/10/2016 0540   O2SAT 96.6 06/10/2016 0540     CBG (last 3)   Recent Labs  06/10/16 0526 06/10/16 0747  GLUCAP 93 77     Imaging: Ct Head Wo Contrast  Result Date: 06/10/2016 CLINICAL DATA:  Altered mental  status. Patient was found unresponsive. History of substance use tonight. EXAM: CT HEAD WITHOUT CONTRAST TECHNIQUE: Contiguous axial images were obtained from the base of the skull through the vertex without intravenous contrast. COMPARISON:  None. FINDINGS: Brain: Circumscribed low dense structure in the right internal capsule region measuring 11 mm diameter. This could represent a lacunar infarct but small cystic lesion not excluded. Consider follow-up with MRI for further evaluation. No mass effect or midline shift. No abnormal extra-axial fluid collections. Gray-white matter junctions are distinct. Basal cisterns are not effaced. No evidence of acute intracranial hemorrhage. Vascular: No hyperdense vessel or unexpected calcification. Skull: Calvarium appears intact. Sinuses/Orbits: Mild mucosal thickening in the paranasal sinuses. No acute air-fluid levels. Mastoid air cells are not opacified. Other: None. IMPRESSION: No acute intracranial abnormalities demonstrated. No acute intracranial hemorrhage. Nonspecific low-attenuation lesion in the right internal capsule could represent cyst or lacunar infarct. Consider follow-up MRI for further evaluation. Electronically Signed   By: Burman NievesWilliam  Stevens M.D.   On: 06/10/2016 00:50   Portable Chest Xray  Result Date: 06/10/2016 CLINICAL DATA:  Intubation. EXAM: PORTABLE CHEST 1 VIEW COMPARISON:  06/09/2016 FINDINGS: An endotracheal  tube is been placed with tip measuring 6.2 cm above the carina. Enteric tube tip is off the field of view below the left hemidiaphragm. Shallow inspiration. Heart size and pulmonary vascularity are normal for technique. There is linear atelectasis in the left mid lung, developing since prior study. Right lung is clear. No blunting of costophrenic angles. No pneumothorax. IMPRESSION: Appliances appear in satisfactory position. Developing linear atelectasis in the left mid lung. Electronically Signed   By: Burman Nieves M.D.   On:  06/10/2016 06:29   Dg Chest Port 1 View  Result Date: 06/09/2016 CLINICAL DATA:  Patient became unresponsive. Altered mental status. Initial encounter. EXAM: PORTABLE CHEST 1 VIEW COMPARISON:  Chest radiograph performed 04/15/2016 FINDINGS: The lungs are well-aerated. Mild retrocardiac opacity may reflect atelectasis or possibly mild infection. There is no evidence of pleural effusion or pneumothorax. The cardiomediastinal silhouette is within normal limits. No acute osseous abnormalities are seen. An enteric tube is noted extending below the diaphragm. IMPRESSION: Mild retrocardiac opacity may reflect atelectasis or possibly mild infection. Would correlate with the patient's symptoms. Electronically Signed   By: Roanna Raider M.D.   On: 06/09/2016 23:55     Studies: CT head 5/26 >> non specific low attenuation in Rt internal capsule  Antibiotics:  Cultures: Urine 5/26 >>  Lines/tubes: ETT 5/26 >>  Events: 5/26 Admit  Assessment/plan:  Acute metabolic encephalopathy. Hx of depression. - RASS goal 0  Compromised airway. - hopefully extubated once he wakes up  Hypokalemia. - f/u BMET  DVT prophylaxis - Lovenox SUP - protonix Nutrition - NPO Goals of care - full code  CC time 32 minutes  Coralyn Helling, MD [facility] Pulmonary/Critical Care 06/10/2016, 8:53 AM Pager:  [phone] After 3pm call: [phone]

## 2016-06-11 DIAGNOSIS — G9341 Metabolic encephalopathy: Secondary | ICD-10-CM

## 2016-06-11 DIAGNOSIS — J9601 Acute respiratory failure with hypoxia: Secondary | ICD-10-CM

## 2016-06-11 LAB — URINE CULTURE
Culture: NO GROWTH
Culture: NO GROWTH

## 2016-06-11 LAB — CBC
HCT: 37 % — ABNORMAL LOW (ref 39.0–52.0)
Hemoglobin: 12.2 g/dL — ABNORMAL LOW (ref 13.0–17.0)
MCH: 28.6 pg (ref 26.0–34.0)
MCHC: 33 g/dL (ref 30.0–36.0)
MCV: 86.9 fL (ref 78.0–100.0)
PLATELETS: 162 10*3/uL (ref 150–400)
RBC: 4.26 MIL/uL (ref 4.22–5.81)
RDW: 13.4 % (ref 11.5–15.5)
WBC: 10.4 10*3/uL (ref 4.0–10.5)

## 2016-06-11 LAB — BASIC METABOLIC PANEL
Anion gap: 7 (ref 5–15)
BUN: 10 mg/dL (ref 6–20)
CALCIUM: 8.3 mg/dL — AB (ref 8.9–10.3)
CO2: 23 mmol/L (ref 22–32)
CREATININE: 0.67 mg/dL (ref 0.61–1.24)
Chloride: 108 mmol/L (ref 101–111)
Glucose, Bld: 89 mg/dL (ref 65–99)
Potassium: 3.6 mmol/L (ref 3.5–5.1)
SODIUM: 138 mmol/L (ref 135–145)

## 2016-06-11 LAB — GLUCOSE, CAPILLARY: GLUCOSE-CAPILLARY: 88 mg/dL (ref 65–99)

## 2016-06-11 NOTE — Progress Notes (Signed)
. PCCM Progress Note  Admission date: 06/09/2016 Referring provider: Dr. Preston FleetingGlick, ER  CC: altered mental status  HPI: 21 yo male brought to ER combative after smoking marijuana and then became unresponsive after getting haldol.  Intubated for airway protection.    Subjective: No acute events overnight. Weaning well on PS 5 this AM  Vital signs: BP 105/64   Pulse 91   Temp 99.8 F (37.7 C) (Oral)   Resp 17   Ht 6\' 2"  (1.88 m)   Wt 121.1 kg (267 lb)   SpO2 99%   BMI 34.28 kg/m   Intake/output: I/O last 3 completed shifts: In: 5557.2 [I.V.:3207.2; IV Piggyback:2350] Out: 3640 [Urine:3640]  Physical exam: General: young male overweight in NAD on vent Neuro: RASS 0. Follows commands. Interacts appropriately HEENT: PERRL no JVD Cardiac: RRR, noMRG Chest: no wheeze Abd: soft, non tender, non-distended Ext: No acute deformity or ROM limitation. No Edema Skin: Grossly intact no rash   CMP Latest Ref Rng & Units 06/10/2016 06/09/2016 04/16/2016  Glucose 65 - 99 mg/dL 91 161(W179(H) 960(A106(H)  BUN 6 - 20 mg/dL 11 18 14   Creatinine 0.61 - 1.24 mg/dL 5.400.69 9.810.71 1.910.70  Sodium 135 - 145 mmol/L 142 142 138  Potassium 3.5 - 5.1 mmol/L 3.5 2.5(LL) 4.0  Chloride 101 - 111 mmol/L 113(H) 106 102  CO2 22 - 32 mmol/L 22 23 28   Calcium 8.9 - 10.3 mg/dL 8.1(L) 8.9 9.4  Total Protein 6.5 - 8.1 g/dL - 7.0 -  Total Bilirubin 0.3 - 1.2 mg/dL - 0.4 -  Alkaline Phos 38 - 126 U/L - 60 -  AST 15 - 41 U/L - 31 -  ALT 17 - 63 U/L - 19 -     CBC Latest Ref Rng & Units 06/10/2016 06/09/2016 04/16/2016  WBC 4.0 - 10.5 K/uL 12.6(H) 8.5 10.7(H)  Hemoglobin 13.0 - 17.0 g/dL 11.8(L) 13.2 14.1  Hematocrit 39.0 - 52.0 % 35.2(L) 38.3(L) 40.8  Platelets 150 - 400 K/uL 222 206 259     ABG    Component Value Date/Time   PHART 7.410 06/10/2016 0540   PCO2ART 35.5 06/10/2016 0540   PO2ART 86.7 06/10/2016 0540   HCO3 22.2 06/10/2016 0540   ACIDBASEDEF 1.8 06/10/2016 0540   O2SAT 96.6 06/10/2016 0540     CBG  (last 3)   Recent Labs  06/10/16 2003 06/10/16 2327 06/11/16 0407  GLUCAP 87 80 88     Imaging: Ct Head Wo Contrast  Result Date: 06/10/2016 CLINICAL DATA:  Altered mental status. Patient was found unresponsive. History of substance use tonight. EXAM: CT HEAD WITHOUT CONTRAST TECHNIQUE: Contiguous axial images were obtained from the base of the skull through the vertex without intravenous contrast. COMPARISON:  None. FINDINGS: Brain: Circumscribed low dense structure in the right internal capsule region measuring 11 mm diameter. This could represent a lacunar infarct but small cystic lesion not excluded. Consider follow-up with MRI for further evaluation. No mass effect or midline shift. No abnormal extra-axial fluid collections. Gray-white matter junctions are distinct. Basal cisterns are not effaced. No evidence of acute intracranial hemorrhage. Vascular: No hyperdense vessel or unexpected calcification. Skull: Calvarium appears intact. Sinuses/Orbits: Mild mucosal thickening in the paranasal sinuses. No acute air-fluid levels. Mastoid air cells are not opacified. Other: None. IMPRESSION: No acute intracranial abnormalities demonstrated. No acute intracranial hemorrhage. Nonspecific low-attenuation lesion in the right internal capsule could represent cyst or lacunar infarct. Consider follow-up MRI for further evaluation. Electronically Signed   By: Burman NievesWilliam  Stevens  M.D.   On: 06/10/2016 00:50   Portable Chest Xray  Result Date: 06/10/2016 CLINICAL DATA:  Intubation. EXAM: PORTABLE CHEST 1 VIEW COMPARISON:  06/09/2016 FINDINGS: An endotracheal tube is been placed with tip measuring 6.2 cm above the carina. Enteric tube tip is off the field of view below the left hemidiaphragm. Shallow inspiration. Heart size and pulmonary vascularity are normal for technique. There is linear atelectasis in the left mid lung, developing since prior study. Right lung is clear. No blunting of costophrenic angles. No  pneumothorax. IMPRESSION: Appliances appear in satisfactory position. Developing linear atelectasis in the left mid lung. Electronically Signed   By: Burman Nieves M.D.   On: 06/10/2016 06:29   Dg Chest Port 1 View  Result Date: 06/09/2016 CLINICAL DATA:  Patient became unresponsive. Altered mental status. Initial encounter. EXAM: PORTABLE CHEST 1 VIEW COMPARISON:  Chest radiograph performed 04/15/2016 FINDINGS: The lungs are well-aerated. Mild retrocardiac opacity may reflect atelectasis or possibly mild infection. There is no evidence of pleural effusion or pneumothorax. The cardiomediastinal silhouette is within normal limits. No acute osseous abnormalities are seen. An enteric tube is noted extending below the diaphragm. IMPRESSION: Mild retrocardiac opacity may reflect atelectasis or possibly mild infection. Would correlate with the patient's symptoms. Electronically Signed   By: Roanna Raider M.D.   On: 06/09/2016 23:55     Studies: CT head 5/26 >> non specific low attenuation in Rt internal capsule  Antibiotics:  Cultures: Urine 5/26 >> neg  Lines/tubes: ETT 5/26 >> 5/28  Events: 5/26 Admit  Assessment/plan:  Acute metabolic encephalopathy. (improved). Etiology not entirely clear. Reportedly he was using marijuana and also working out in the heat prior to this episode. Hx of depression. - Monitor  Compromised airway. (improved) - Extubate today - IS  Hypokalemia. - f/u BMET in AM - Supp as indicated  DVT prophylaxis - Lovenox SUP - RN to advance to regular diet as tolerated. Goals of care - full code  CC time 30 min  Joneen Roach, AGACNP-BC [facility] Pulmonology/Critical Care Pager [phone] or [phone]  06/11/2016 10:20 AM

## 2016-06-11 NOTE — Procedures (Signed)
Extubation Procedure Note  Patient Details:   Name: Karna ChristmasChristopher M Debruyne DOB: 14-Jun-1995 MRN: [id number]   Airway Documentation:  Airway 8 mm (Active)  Secured at (cm) 25 cm 06/11/2016  8:22 AM  Measured From Lips 06/11/2016  8:22 AM  Secured Location Center 06/11/2016  8:22 AM  Secured By [employer] Tube Holder 06/11/2016  8:22 AM  Tube Holder Repositioned Yes 06/11/2016  8:22 AM  Site Condition Dry 06/11/2016  8:22 AM    Evaluation  O2 sats: Stable Throughout Complications: No apparent complications. Cuff leak noted prior to extubation. Patient did tolerate procedure well. Bilateral Breath Sounds: Diminished, Clear   Patient extubated to 3 lpm [place] without incident. Patient able to speak and is alert and oriented.   Italyhad M Kamrie Fanton 06/11/2016, 10:43 AM

## 2016-06-12 DIAGNOSIS — R7989 Other specified abnormal findings of blood chemistry: Secondary | ICD-10-CM

## 2016-06-12 DIAGNOSIS — R451 Restlessness and agitation: Secondary | ICD-10-CM

## 2016-06-12 LAB — BASIC METABOLIC PANEL
Anion gap: 9 (ref 5–15)
BUN: 9 mg/dL (ref 6–20)
CHLORIDE: 102 mmol/L (ref 101–111)
CO2: 25 mmol/L (ref 22–32)
Calcium: 8.7 mg/dL — ABNORMAL LOW (ref 8.9–10.3)
Creatinine, Ser: 0.73 mg/dL (ref 0.61–1.24)
GFR calc Af Amer: >60 mL/min (ref >60)
GFR calc non Af Amer: >60 mL/min (ref >60)
GLUCOSE: 97 mg/dL (ref 65–99)
POTASSIUM: 3.5 mmol/L (ref 3.5–5.1)
Sodium: 136 mmol/L (ref 135–145)

## 2016-06-12 LAB — CBC
HEMATOCRIT: 39.9 % (ref 39.0–52.0)
Hemoglobin: 13.1 g/dL (ref 13.0–17.0)
MCH: 28.5 pg (ref 26.0–34.0)
MCHC: 32.8 g/dL (ref 30.0–36.0)
MCV: 86.7 fL (ref 78.0–100.0)
Platelets: 208 10*3/uL (ref 150–400)
RBC: 4.6 MIL/uL (ref 4.22–5.81)
RDW: 13.2 % (ref 11.5–15.5)
WBC: 8.9 10*3/uL (ref 4.0–10.5)

## 2016-06-12 NOTE — Progress Notes (Signed)
PULMONARY / CRITICAL CARE MEDICINE   Name: Ronald [facility] MRN: [id number] DOB: 1995-09-23    ADMISSION DATE:  06/09/2016 CONSULTATION DATE:  06/09/16  REFERRING MD:  Preston FleetingGlick  CHIEF COMPLAINT:  Confusion, combativeness  HISTORY OF PRESENT ILLNESS:   21 yo male admitted with acute encephalopathy after drinking a beer and smoking marijuana.    SUBJECTIVE:  Passing sbt  No other acute events  VITAL SIGNS: BP 121/62   Pulse 76   Temp 98.4 F (36.9 C) (Oral)   Resp 19   Ht 6\' 2"  (1.88 m)   Wt 121.1 kg (267 lb)   SpO2 95%   BMI 34.28 kg/m   HEMODYNAMICS:    VENTILATOR SETTINGS:    INTAKE / OUTPUT: I/O last 3 completed shifts: In: 1737.3 [P.O.:540; I.V.:1197.3] Out: 3740 [Urine:3740]  PHYSICAL EXAMINATION: General:  Awake alert Neuro:  Awake, alert, no distress HEENT:  NCAT OP clear Cardiovascular:  RRR, no gr Lungs:  Clear to ausculation Abdomen:  BS+, soft, nontedner Musculoskeletal:  Normal bulk and tone Skin:  No rash or skin breakdown  LABS:  BMET  Recent Labs Lab 06/10/16 0641 06/11/16 0927 06/12/16 0138  NA 142 138 136  K 3.5 3.6 3.5  CL 113* 108 102  CO2 22 23 25   BUN 11 10 9   CREATININE 0.69 0.67 0.73  GLUCOSE 91 89 97    Electrolytes  Recent Labs Lab 06/09/16 2323 06/10/16 0641 06/11/16 0927 06/12/16 0138  CALCIUM 8.9 8.1* 8.3* 8.7*  MG 1.6*  --   --   --     CBC  Recent Labs Lab 06/10/16 0641 06/11/16 0927 06/12/16 0138  WBC 12.6* 10.4 8.9  HGB 11.8* 12.2* 13.1  HCT 35.2* 37.0* 39.9  PLT 222 162 208    Coag's No results for input(s): APTT, INR in the last 168 hours.  Sepsis Markers  Recent Labs Lab 06/09/16 2323 06/10/16 0242  LATICACIDVEN 4.2* 2.0*    ABG  Recent Labs Lab 06/10/16 0121 06/10/16 0540  PHART 7.442 7.410  PCO2ART 33.9 35.5  PO2ART 139* 86.7    Liver Enzymes  Recent Labs Lab 06/09/16 2323  AST 31  ALT 19  ALKPHOS 60  BILITOT 0.4  ALBUMIN 4.2    Cardiac Enzymes No  results for input(s): TROPONINI, PROBNP in the last 168 hours.  Glucose  Recent Labs Lab 06/10/16 0747 06/10/16 1225 06/10/16 1700 06/10/16 2003 06/10/16 2327 06/11/16 0407  GLUCAP 77 77 90 87 80 88    Imaging No results found.   STUDIES:  Ct head 5/27 > NAICP, but non-specific hypoattenuation in the R internal capsule  CULTURES:   ANTIBIOTICS:   SIGNIFICANT EVENTS:   LINES/TUBES: 5/27 ETT  DISCUSSION: 21 y/o male intubated for inability to protect airway post drinking EtOH and using marijuana and receiving haldol by EMS  ASSESSMENT / PLAN:  PULMONARY A: Acute respiratory failure> resolved P:   Extubated Advance diet   NEUROLOGIC A:   Acute encephalopathy due to polysubstance abuse> resolved Abnormal CT head P:   MRI Brain as outpatient Counseled to quit EtOH, Marijuana and only take medicines that are prescribed  D/c home today  Heber CarolinaBrent McQuaid, MD [place] [facility] Pager: [phone] Cell: [phone] After 3pm or if no response, call [phone]   06/12/2016, 10:26 AM

## 2016-06-12 NOTE — Care Management Note (Addendum)
Case Management Note  Patient Details  Name: [name] MRN: [id number] Date of Birth: 17-Nov-1995  Subjective/Objective:       Pt admitted AMS in the setting of substance abuse             Action/Plan:   PTA from home with parents.  CM set up appt [facility]CP for 06/15/16 at 2pm.  Appt Information communicated directly to pt in addition CM provided via printed brochure and AVS. Pt [facility]  CM provided MATCH letter and explained in detail -states [facility] [place] [facility] [place]   Expected Discharge Date:  06/12/16               Expected Discharge Plan:  Home/Self Care  In-House Referral:     Discharge planning Services  CM Consult  Post Acute Care Choice:    Choice offered to:     DME Arranged:    DME Agency:     HH Arranged:    HH Agency:     Status of Service:     If discussed at [employer] Meetings, dates discussed:    Additional Comments:  Cherylann ParrClaxton, Kali Ambler S, RN 06/12/2016, 11:09 AM

## 2016-06-12 NOTE — Discharge Summary (Signed)
Physician Discharge Summary       Patient ID: Ronald ChristmasChristopher M Watts MRN: [id number] DOB/AGE: 1995/01/24 21 y.o.  Admit date: 06/09/2016 Discharge date: 06/12/2016  Discharge Diagnoses:  Acute Respiratory failure Acute encephalopathy Polysubstance overdose H/o depression Abnormal CT brain  Detailed Hospital Course:  21 y/o man with a past medical history of depression who was brought to the [facility] ED with altered mental status.  He was reportedly in his normal state of health on the evening of admit.  He then went out with a friend and smoked marijuana.  The friend apparently did not have any adverse effects.  EMS gave him 2 doses of naloxone after which he became very agitated and combative. He had also apparently taken a "friends" Seroquel w/ some alcohol. In ER He was then given 10mg  haldol with no effect.  He was then intubated. He was admitted to the intensive care. Treatment included telemetry, IV hydration and monitoring. He was successfully extubated on 5/28. Deemed ready for dc as of 5/29 w/ plan as outlined below.    Discharge Plan by active problems  Resolved acute encephalopathy in setting of polysubstance abuse.  Plan Dc to home Instructed NOT to take Seroquel OR any medication NOT prescribed by medical provider.  Also instructed to stay sober  Recommended that he see his provider who suggested that he start "celexa, Abilify and trazodone" as he never started these medications and this regimen may need to be re-evaluated. We did not prescribe these for him   Abnormal CT brain Plan Needs MRI brain as out-patient     Significant Hospital tests/ studies  Consults: none  CT brain 5/27: "No acute intracranial abnormalities demonstrated. No acute intracranial hemorrhage. Nonspecific low-attenuation lesion in the right internal capsule could represent cyst or lacunar infarct".  Discharge Exam: BP 121/62   Pulse 76   Temp 98.4 F (36.9 C) (Oral)   Resp 19   Ht 6\' 2"   (1.88 m)   Wt 267 lb (121.1 kg)   SpO2 95%   BMI 34.28 kg/m  Room air  General:  awake, no distress.  Neuro:  awake and alert w/out focal def  HEENT: NCAT, no JVD  Cardiovascular: RRR w/out M/G Lungs:  clear and w/out accessory use  Abdomen:  soft, not tender  Musculoskeletal:  equal st and bulk Skin:  warm and dry   Labs at discharge Lab Results  Component Value Date   CREATININE 0.73 06/12/2016   BUN 9 06/12/2016   NA 136 06/12/2016   K 3.5 06/12/2016   CL 102 06/12/2016   CO2 25 06/12/2016   Lab Results  Component Value Date   WBC 8.9 06/12/2016   HGB 13.1 06/12/2016   HCT 39.9 06/12/2016   MCV 86.7 06/12/2016   PLT 208 06/12/2016   Lab Results  Component Value Date   ALT 19 06/09/2016   AST 31 06/09/2016   ALKPHOS 60 06/09/2016   BILITOT 0.4 06/09/2016   No results found for: INR, PROTIME  Current radiology studies No results found.  Disposition:  01-Home or Self Care  Discharge Instructions    Diet - low sodium heart healthy    Complete by:  As directed    Increase activity slowly    Complete by:  As directed      Allergies as of 06/12/2016      Reactions   Latex Rash      Medication List    You have not been prescribed any medications.  Discharged Condition: good  Physician Statement:   The Patient was personally examined, the discharge assessment and plan has been personally reviewed and I agree with ACNP Keshaun Dubey's assessment and plan. 32 minutes of time have been dedicated to discharge assessment, planning and discharge instructions.   Signed: Shelby Mattocks 06/12/2016, 11:00 AM

## 2016-06-12 NOTE — Discharge Instructions (Signed)
1) You need to see the Doctor who prescribed the medications for your depression so that a proper medication regimen can be established. 2) You need to see a primary care provider.  -specifically you will need them to assist you in getting an MRI of your brain given the abnormal CT of head findings.  3) NEVER take medications that are not prescribed for you  4) would avoid alcohol and certainly mixing alcohol with both prescribed and recreational drugs.

## 2016-06-12 NOTE — Clinical Social Work Note (Signed)
CSW consulted for substance abuse. CSW provided pt with substance abuse resources including [facility]. Pt was receptive and thankful. Clinical Social Worker will sign off for now as social work intervention is no longer needed. Please consult us again if new need arises.   Clarisse GougeBridget A Mance Vallejo 06/12/2016

## 2016-06-15 ENCOUNTER — Encounter: Payer: Self-pay | Admitting: Family Medicine

## 2016-06-15 ENCOUNTER — Ambulatory Visit (INDEPENDENT_AMBULATORY_CARE_PROVIDER_SITE_OTHER): Payer: Self-pay | Admitting: Family Medicine

## 2016-06-15 VITALS — BP 124/60 | HR 90 | Temp 98.6°F | Resp 14 | Ht 74.0 in | Wt 236.0 lb

## 2016-06-15 DIAGNOSIS — E876 Hypokalemia: Secondary | ICD-10-CM

## 2016-06-15 DIAGNOSIS — J9600 Acute respiratory failure, unspecified whether with hypoxia or hypercapnia: Secondary | ICD-10-CM

## 2016-06-15 DIAGNOSIS — R930 Abnormal findings on diagnostic imaging of skull and head, not elsewhere classified: Secondary | ICD-10-CM

## 2016-06-15 DIAGNOSIS — F333 Major depressive disorder, recurrent, severe with psychotic symptoms: Secondary | ICD-10-CM

## 2016-06-15 LAB — POCT URINALYSIS DIP (DEVICE)
Bilirubin Urine: NEGATIVE
GLUCOSE, UA: NEGATIVE mg/dL
Hgb urine dipstick: NEGATIVE
KETONES UR: NEGATIVE mg/dL
LEUKOCYTES UA: NEGATIVE
Nitrite: NEGATIVE
Protein, ur: NEGATIVE mg/dL
SPECIFIC GRAVITY, URINE: 1.02 (ref 1.005–1.030)
UROBILINOGEN UA: 0.2 mg/dL (ref 0.0–1.0)
pH: 7 (ref 5.0–8.0)

## 2016-06-15 MED ORDER — TRAZODONE HCL 50 MG PO TABS: 50.0000 mg | ORAL_TABLET | Freq: Every evening | ORAL | 0 refills | Status: AC | PRN

## 2016-06-15 MED ORDER — ESCITALOPRAM OXALATE 10 MG PO TABS: 10.0000 mg | ORAL_TABLET | Freq: Every day | ORAL | 0 refills | Status: DC

## 2016-06-15 MED ORDER — ARIPIPRAZOLE 2 MG PO TABS: 2.0000 mg | ORAL_TABLET | Freq: Every day | ORAL | 0 refills | Status: DC

## 2016-06-15 NOTE — Patient Instructions (Signed)
Return for follow-up in 3 weeks.  I will follow-up on your MRI and Chest X-ray and make you aware of any scheduling changes.   Abilify 2 mg, Escitalopram 10 mg, and Trazodone 50 mg at bedtime.  I will notif

## 2016-06-15 NOTE — Progress Notes (Signed)
Patient ID: Ronald Gray, male    DOB: Mar 21, 1995, 21 y.o.   MRN: [id number]  PCP: Ronald NeighborsHarris, Avarose Mervine S, FNP   Chief Complaint  Patient presents with  . Establish Care  . Hospitalization Follow-up     Subjective:  HPI  Ronald Gray is a 21 y.o. male presents to establish care and hospital follow-up. Medical problems significant for major depression with psychosis and hx auditory hallucinations. Ronald Gray reports that he was recently hospitalized at [facility] 06/09/16, for heat stroke.  According to hospital discharge summary, Ronald Gray presented to ED via EMS with altered mental  Status after an evening out with a friend in which he smoked marijuana. It was later revealed he took  some of a friends Seroquel with alcohol. He became very combative and ultimately required intubation.  He is a very poor historian regarding his medical history and [facility] health history. He admits [facility] which [facility] never followed up. Last [facility] admission 09/11/14 for  evaluation of hallucinations. Reports that the doses of Seroquel were taking to induce sleep. Reports a long  history of insomnia with difficulty falling and staying asleep. He denies any present depression or thoughts of suicide. He resides with mother and siblings. 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 However ,he is uninsured. He admits to occasional marijuana use , smoke cigarettes, and drink liquor and beer. Reports a good appetite eating 2-3 meals per day.    Social History   Social History  . Marital status: Single    Spouse name: N/A  . Number of children: N/A  . Years of education: N/A   Occupational History  . Not on file.   Social History Main Topics  . Smoking status: Never Smoker  . Smokeless tobacco: Never Used  . Alcohol use No  . Drug use: Yes     Comment: pt came in under a combative state from unknown  substance  . Sexual activity: Not on file   Other Topics Concern  . Not on file   Social History Narrative  . No narrative on file    History reviewed. No pertinent family history.  Review of Systems  See HPI  Patient Active Problem List   Diagnosis Date Noted  . Elevated lactic acid level   . Agitation 06/10/2016  . Cannabis use disorder, moderate, dependence (HCC) 09/13/2014  . Severe major depression with psychotic features, mood-congruent (HCC) 09/11/2014    Allergies  Allergen Reactions  . Latex Rash    Prior to Admission medications   Not on File    Past Medical, Surgical Family and Social History reviewed and updated.    Objective:   Today'Gray Vitals   06/15/16 1421  BP: 124/60  Pulse: 90  Resp: 14  Temp: 98.6 F (37 C)  TempSrc: Oral  SpO2: 99%  Weight: 236 lb (107 kg)  Height: 6\' 2"  (1.88 m)    Wt Readings from Last 3 Encounters:  06/15/16 236 lb (107 kg)  06/09/16 267 lb (121.1 kg)  04/15/16 267 lb (121.1 kg)     Depression screen PHQ 2/9 06/15/2016  Decreased Interest 0  Down, Depressed, Hopeless 0  PHQ - 2 Score 0     Physical Exam  Constitutional: He is oriented to person, place, and time. He appears well-developed and well-nourished.  HENT:  Head: Normocephalic and atraumatic.  Right Ear: External ear normal.  Left Ear:  External ear normal.  Mouth/Throat: Oropharynx is clear and moist.  Eyes: Conjunctivae and EOM are normal. Pupils are equal, round, and reactive to light.  Neck: Normal range of motion. Neck supple. No thyromegaly present.  Cardiovascular: Normal rate, regular rhythm, normal heart sounds and intact distal pulses.   Pulmonary/Chest: Effort normal and breath sounds normal.  Abdominal: Soft. Bowel sounds are normal.  Musculoskeletal: Normal range of motion.  Neurological: He is alert and oriented to person, place, and time. He has normal reflexes.  Skin: Skin is warm and dry.  Psychiatric: Judgment normal. He is  slowed. He expresses no suicidal plans and no homicidal plans.  Flat affect and slowed speech  He is inattentive.    Ct Head Wo Contrast  Result Date: 06/10/2016 CLINICAL DATA:  Altered mental status. Patient was found unresponsive. History of substance use tonight. EXAM: CT HEAD WITHOUT CONTRAST TECHNIQUE: Contiguous axial images were obtained from the base of the skull through the vertex without intravenous contrast. COMPARISON:  None. FINDINGS: Brain: Circumscribed low dense structure in the right internal capsule region measuring 11 mm diameter. This could represent a lacunar infarct but small cystic lesion not excluded. Consider follow-up with MRI for further evaluation. No mass effect or midline shift. No abnormal extra-axial fluid collections. Gray-white matter junctions are distinct. Basal cisterns are not effaced. No evidence of acute intracranial hemorrhage. Vascular: No hyperdense vessel or unexpected calcification. Skull: Calvarium appears intact. Sinuses/Orbits: Mild mucosal thickening in the paranasal sinuses. No acute air-fluid levels. Mastoid air cells are not opacified. Other: None. IMPRESSION: No acute intracranial abnormalities demonstrated. No acute intracranial hemorrhage. Nonspecific low-attenuation lesion in the right internal capsule could represent cyst or lacunar infarct. Consider follow-up MRI for further evaluation. Electronically Signed   By: Burman Nieves M.D.   On: 06/10/2016 00:50   Portable Chest Xray  Result Date: 06/10/2016 CLINICAL DATA:  Intubation. EXAM: PORTABLE CHEST 1 VIEW COMPARISON:  06/09/2016 FINDINGS: An endotracheal tube is been placed with tip measuring 6.2 cm above the carina. Enteric tube tip is off the field of view below the left hemidiaphragm. Shallow inspiration. Heart size and pulmonary vascularity are normal for technique. There is linear atelectasis in the left mid lung, developing since prior study. Right lung is clear. No blunting of  costophrenic angles. No pneumothorax. IMPRESSION: Appliances appear in satisfactory position. Developing linear atelectasis in the left mid lung. Electronically Signed   By: Burman Nieves M.D.   On: 06/10/2016 06:29   retrocardiac opacity may reflect atelectasis or possibly mild infection. Would correlate with the patient'Gray symptoms. EDg Chest Port 1 View  Assessment & Plan:  1. Severe episode of recurrent major depressive disorder, with psychotic features (HCC) Resuming psychiatric medications  -Escitalopram 10 mg, daily at bedtime  -Aripiprazole 2 mg daily  -Follow-up with [facility] and [facility] to [facility]   2. Acute respiratory failure, unspecified whether with hypoxia or hypercapnia (HCC) -Stable, no additional follow-up warranted.  3. Hypokalemia -Repeating potassium   4. Abnormal Head CT -MRI Brain w/o contrast   5. Insomnia  -Trazodone 50 mg once daily at bedtime  Orders For Encounter - Ambulatory referral to Psychiatry - CBC with Differential - COMPLETE METABOLIC PANEL WITH GFR - Thyroid Panel With TSH  RTC: 1 month for mental health follow-up    Godfrey Pick. Tiburcio Pea, MSN, FNP-C [facility]  [address] [place], [place] [address] [phone]

## 2016-06-16 LAB — CBC WITH DIFFERENTIAL/PLATELET
BASOS ABS: 0 {cells}/uL (ref 0–200)
Basophils Relative: 0 %
Eosinophils Absolute: 558 cells/uL — ABNORMAL HIGH (ref 15–500)
Eosinophils Relative: 6 %
HEMATOCRIT: 41.6 % (ref 38.5–50.0)
HEMOGLOBIN: 14 g/dL (ref 13.2–17.1)
LYMPHS ABS: 3441 {cells}/uL (ref 850–3900)
Lymphocytes Relative: 37 %
MCH: 29.3 pg (ref 27.0–33.0)
MCHC: 33.7 g/dL (ref 32.0–36.0)
MCV: 87 fL (ref 80.0–100.0)
MONO ABS: 465 {cells}/uL (ref 200–950)
MPV: 8.9 fL (ref 7.5–12.5)
Monocytes Relative: 5 %
NEUTROS ABS: 4836 {cells}/uL (ref 1500–7800)
NEUTROS PCT: 52 %
Platelets: 338 10*3/uL (ref 140–400)
RBC: 4.78 MIL/uL (ref 4.20–5.80)
RDW: 13.4 % (ref 11.0–15.0)
WBC: 9.3 10*3/uL (ref 3.8–10.8)

## 2016-06-16 LAB — COMPLETE METABOLIC PANEL WITH GFR
ALBUMIN: 4.5 g/dL (ref 3.6–5.1)
ALK PHOS: 69 U/L (ref 40–115)
ALT: 23 U/L (ref 9–46)
AST: 20 U/L (ref 10–40)
BILIRUBIN TOTAL: 0.3 mg/dL (ref 0.2–1.2)
BUN: 12 mg/dL (ref 7–25)
CALCIUM: 9.7 mg/dL (ref 8.6–10.3)
CO2: 24 mmol/L (ref 20–31)
CREATININE: 0.8 mg/dL (ref 0.60–1.35)
Chloride: 104 mmol/L (ref 98–110)
GFR, Est African American: >89 mL/min (ref >=60)
GFR, Est Non African American: >89 mL/min (ref >=60)
GLUCOSE: 83 mg/dL (ref 65–99)
Potassium: 4.5 mmol/L (ref 3.5–5.3)
SODIUM: 141 mmol/L (ref 135–146)
TOTAL PROTEIN: 7.2 g/dL (ref 6.1–8.1)

## 2016-06-16 LAB — THYROID PANEL WITH TSH
Free Thyroxine Index: 2 (ref 1.4–3.8)
T3 Uptake: 35 % (ref 22–35)
T4 TOTAL: 5.8 ug/dL (ref 4.5–12.0)
TSH: 1 m[IU]/L (ref 0.40–4.50)

## 2016-06-22 ENCOUNTER — Ambulatory Visit (HOSPITAL_COMMUNITY): Admission: RE | Admit: 2016-06-22 | Payer: Self-pay | Source: Ambulatory Visit

## 2016-07-09 ENCOUNTER — Encounter: Payer: Self-pay | Admitting: Family Medicine

## 2016-07-09 ENCOUNTER — Ambulatory Visit (INDEPENDENT_AMBULATORY_CARE_PROVIDER_SITE_OTHER): Payer: Self-pay | Admitting: Family Medicine

## 2016-07-09 VITALS — BP 140/70 | HR 71 | Temp 98.0°F | Resp 16 | Ht 74.0 in | Wt 240.4 lb

## 2016-07-09 DIAGNOSIS — R451 Restlessness and agitation: Secondary | ICD-10-CM

## 2016-07-09 DIAGNOSIS — F323 Major depressive disorder, single episode, severe with psychotic features: Secondary | ICD-10-CM

## 2016-07-09 MED ORDER — ESCITALOPRAM OXALATE 10 MG PO TABS: 5.0000 mg | ORAL_TABLET | Freq: Every day | ORAL | 5 refills | Status: DC

## 2016-07-09 MED ORDER — ESCITALOPRAM OXALATE 10 MG PO TABS: 5.0000 mg | ORAL_TABLET | Freq: Every day | ORAL | 0 refills | Status: DC

## 2016-07-09 NOTE — Progress Notes (Signed)
Patient ID: Ronald ChristmasChristopher M Que, male    DOB: 1995-02-10, 21 y.o.   MRN: [id number]  PCP: Bing NeighborsHarris, Braeden Kennan S, FNP  Chief Complaint  Patient presents with  . Follow-up    Mental Health    Subjective:  HPI Ronald Gray is a 21 y.o. male presents for evaluation of anxiety, depression, and insomnia. [name] reports improved sleep quality since taking trazodone 50 mg at bedtime. He also reports an increased improvement in his moved and sensation of anxiety since restarting Abilify and Lexapro. He feels that the Lexapro is possibly contributing to chronic dizziness. His previously taken Lexapro and reports that it has caused dizziness. He reports that he feels significantly better than when he initially presented in office on 06/19/2016. He denies taking any medications that are not prescribed to him, feelings of depression, or thoughts of self harm or harm to others.   Social History   Social History  . Marital status: Single    Spouse name: N/A  . Number of children: N/A  . Years of education: N/A   Occupational History  . Not on file.   Social History Main Topics  . Smoking status: Never Smoker  . Smokeless tobacco: Never Used  . Alcohol use No  . Drug use: Yes     Comment: pt came in under a combative state from unknown substance  . Sexual activity: Not on file   Other Topics Concern  . Not on file   Social History Narrative  . No narrative on file   Review of Systems See history of present illness  Patient Active Problem List   Diagnosis Date Noted  . Elevated lactic acid level   . Agitation 06/10/2016  . Cannabis use disorder, moderate, dependence (HCC) 09/13/2014  . Severe major depression with psychotic features, mood-congruent (HCC) 09/11/2014    Allergies  Allergen Reactions  . Latex Rash    Prior to Admission medications   Medication Sig Start Date End Date Taking? Authorizing Provider  ARIPiprazole (ABILIFY) 2 MG tablet Take 1  tablet (2 mg total) by mouth daily. 06/15/16  Yes Bing NeighborsHarris, Crimson Beer S, FNP  escitalopram (LEXAPRO) 10 MG tablet Take 1 tablet (10 mg total) by mouth at bedtime. 06/15/16  Yes Bing NeighborsHarris, Neville Pauls S, FNP  traZODone (DESYREL) 50 MG tablet Take 1 tablet (50 mg total) by mouth at bedtime as needed for sleep. 06/15/16  Yes Bing NeighborsHarris, Chaz Mcglasson S, FNP    Past Medical, Surgical Family and Social History reviewed and updated.    Objective:   Today's Vitals   07/09/16 1421  BP: 140/70  Pulse: 71  Resp: 16  Temp: 98 F (36.7 C)  TempSrc: Oral  SpO2: 98%  Weight: 240 lb 6.4 oz (109 kg)  Height: 6\' 2"  (1.88 m)    Wt Readings from Last 3 Encounters:  07/09/16 240 lb 6.4 oz (109 kg)  06/15/16 236 lb (107 kg)  06/09/16 267 lb (121.1 kg)   Physical Exam  Constitutional: He is oriented to person, place, and time. He appears well-developed and well-nourished.  HENT:  Head: Normocephalic and atraumatic.  Eyes: Conjunctivae and EOM are normal. Pupils are equal, round, and reactive to light.  Neck: Normal range of motion. Neck supple.  Cardiovascular: Normal rate, regular rhythm, normal heart sounds and intact distal pulses.   Pulmonary/Chest: Effort normal and breath sounds normal.  Musculoskeletal: Normal range of motion.  Neurological: He is alert and oriented to person, place, and time.  Skin: Skin is warm.  Psychiatric: He has a normal mood and affect. His behavior is normal. Judgment and thought content normal.     Assessment & Plan:  1. Severe major depression with psychotic features, mood-congruent (HCC) 2. Agitation -Decreased escitalopram 5 mg at bedtime. -Continue Abilify and trazodone as prescribed. -We will reschedule your MRI and notify you of the date and time. Please make sure that you attend this diagnostic study.  RTC: 4 months for evaluation of depression and anxiety.  Godfrey Pick. Tiburcio Pea, MSN, FNP-C [facility]  [address] [place], [place]  [address] [phone]

## 2016-07-09 NOTE — Patient Instructions (Addendum)
We will reschedule your MRI of the brain he will be notified of the date and time of appointment.  I am reducing her dose of ecitalopram from 10 mg to 5 mg at bedtime.

## 2016-07-13 ENCOUNTER — Ambulatory Visit (HOSPITAL_COMMUNITY)
Admission: RE | Admit: 2016-07-13 | Discharge: 2016-07-13 | Disposition: A | Discharge status: Home / Self Care | Payer: Self-pay | Source: Ambulatory Visit | Attending: Family Medicine | Admitting: Family Medicine

## 2016-07-13 DIAGNOSIS — G930 Cerebral cysts: Secondary | ICD-10-CM | POA: Insufficient documentation

## 2016-07-13 DIAGNOSIS — R930 Abnormal findings on diagnostic imaging of skull and head, not elsewhere classified: Secondary | ICD-10-CM | POA: Insufficient documentation

## 2016-07-13 DIAGNOSIS — J352 Hypertrophy of adenoids: Secondary | ICD-10-CM | POA: Insufficient documentation

## 2016-07-14 ENCOUNTER — Other Ambulatory Visit: Payer: Self-pay | Admitting: Family Medicine

## 2016-07-14 DIAGNOSIS — R9089 Other abnormal findings on diagnostic imaging of central nervous system: Secondary | ICD-10-CM

## 2016-07-14 NOTE — Progress Notes (Signed)
Referral to neurology, r/t abnormal follow-up MRI Brain from recent hospitalization 06/09/2016.

## 2016-08-07 ENCOUNTER — Encounter (HOSPITAL_COMMUNITY): Payer: Self-pay | Admitting: Emergency Medicine

## 2016-08-07 ENCOUNTER — Emergency Department (HOSPITAL_COMMUNITY)
Admission: EM | Admit: 2016-08-07 | Discharge: 2016-08-07 | Disposition: A | Discharge status: Home / Self Care | Payer: Self-pay | Attending: Emergency Medicine | Admitting: Emergency Medicine

## 2016-08-07 DIAGNOSIS — Y939 Activity, unspecified: Secondary | ICD-10-CM | POA: Insufficient documentation

## 2016-08-07 DIAGNOSIS — Z79899 Other long term (current) drug therapy: Secondary | ICD-10-CM | POA: Insufficient documentation

## 2016-08-07 DIAGNOSIS — Y9289 Other specified places as the place of occurrence of the external cause: Secondary | ICD-10-CM | POA: Insufficient documentation

## 2016-08-07 DIAGNOSIS — F17210 Nicotine dependence, cigarettes, uncomplicated: Secondary | ICD-10-CM | POA: Insufficient documentation

## 2016-08-07 DIAGNOSIS — W57XXXA Bitten or stung by nonvenomous insect and other nonvenomous arthropods, initial encounter: Secondary | ICD-10-CM

## 2016-08-07 DIAGNOSIS — Y990 Civilian activity done for income or pay: Secondary | ICD-10-CM | POA: Insufficient documentation

## 2016-08-07 DIAGNOSIS — L03313 Cellulitis of chest wall: Secondary | ICD-10-CM

## 2016-08-07 DIAGNOSIS — T63441A Toxic effect of venom of bees, accidental (unintentional), initial encounter: Secondary | ICD-10-CM | POA: Insufficient documentation

## 2016-08-07 MED ORDER — CEPHALEXIN 500 MG PO CAPS: 500.0000 mg | ORAL_CAPSULE | Freq: Four times a day (QID) | ORAL | 0 refills | Status: DC

## 2016-08-07 MED ORDER — DIPHENHYDRAMINE HCL 25 MG PO CAPS
25.0000 mg | ORAL_CAPSULE | Freq: Once | ORAL | Status: AC
  Administered 2016-08-07: 25 mg via ORAL
  Filled 2016-08-07: qty 1

## 2016-08-07 MED ORDER — CEPHALEXIN 500 MG PO CAPS
500.0000 mg | ORAL_CAPSULE | Freq: Once | ORAL | Status: AC
  Administered 2016-08-07: 500 mg via ORAL
  Filled 2016-08-07: qty 1

## 2016-08-07 MED ORDER — DIPHENHYDRAMINE HCL 25 MG PO CAPS: 25.0000 mg | ORAL_CAPSULE | Freq: Four times a day (QID) | ORAL | 0 refills | Status: DC | PRN

## 2016-08-07 NOTE — ED Notes (Signed)
Pt alert & oriented x4, stable gait. Patient given discharge instructions, paperwork & prescription(s). Patient  instructed to stop at the registration desk to finish any additional paperwork. Patient verbalized understanding. Pt left department w/ no further questions. 

## 2016-08-07 NOTE — Discharge Instructions (Signed)
As discussed,  your exam suggests a histamine reaction to the hornet that stung you yesterday (localized reaction) which should get better by taking benadryl and using ice packs.  You are being covered however for the possibility of a skin infection called cellulitis. Take the entire course of the antibiotic prescribed.

## 2016-08-07 NOTE — ED Notes (Signed)
Noted redness & warmth to the left chest. Pt states he was stung in yesterday by a bee.

## 2016-08-07 NOTE — ED Triage Notes (Addendum)
Bee sting at work yesterday on chest, red, swollen, feels tight, denies SOB

## 2016-08-09 NOTE — ED Provider Notes (Signed)
[facility] DEPT Provider Note   CSN: [id number] Arrival date & time: 08/07/16  1821     History   Chief Complaint Chief Complaint  Patient presents with  . Allergic Reaction    HPI Ronald Gray is a 21 y.o. male.  The history is provided by the patient and a parent.  Allergic Reaction  Presenting symptoms: itching, rash and swelling   Presenting symptoms: no difficulty breathing, no difficulty swallowing and no wheezing   Severity:  Moderate Duration:  1 day Prior allergic episodes:  No prior episodes Context: insect bite/sting   Context comment:  Pt was stung by a hornet while [place]. Relieved by:  Nothing Worsened by:  Nothing Ineffective treatments:  None tried   Past Medical History:  Diagnosis Date  . Depression     Patient Active Problem List   Diagnosis Date Noted  . Elevated lactic acid level   . Agitation 06/10/2016  . Cannabis use disorder, moderate, dependence (HCC) 09/13/2014  . Severe major depression with psychotic features, mood-congruent (HCC) 09/11/2014    History reviewed. No pertinent surgical history.     Home Medications    Prior to Admission medications   Medication Sig Start Date End Date Taking? Authorizing Provider  ARIPiprazole (ABILIFY) 2 MG tablet Take 1 tablet (2 mg total) by mouth daily. 06/15/16   Bing NeighborsHarris, Kimberly S, FNP  cephALEXin (KEFLEX) 500 MG capsule Take 1 capsule (500 mg total) by mouth 4 (four) times daily. 08/07/16   Burgess AmorIdol, Dreyton Roessner, PA-C  diphenhydrAMINE (BENADRYL) 25 mg capsule Take 1 capsule (25 mg total) by mouth every 6 (six) hours as needed. 08/07/16   Burgess AmorIdol, Shawon Denzer, PA-C  escitalopram (LEXAPRO) 10 MG tablet Take 0.5 tablets (5 mg total) by mouth at bedtime. 07/09/16   Bing NeighborsHarris, Kimberly S, FNP  traZODone (DESYREL) 50 MG tablet Take 1 tablet (50 mg total) by mouth at bedtime as needed for sleep. 06/15/16   Bing NeighborsHarris, Kimberly S, FNP    Family History No family history on  file.  Social History Social History  Substance Use Topics  . Smoking status: Current Every Day Smoker    Packs/day: 0.25    Types: Cigarettes  . Smokeless tobacco: Never Used  . Alcohol use Yes     Comment: occ     Allergies   Bee venom and Latex   Review of Systems Review of Systems  Constitutional: Negative for chills and fever.  HENT: Negative for trouble swallowing.   Respiratory: Negative for chest tightness, shortness of breath and wheezing.   Skin: Positive for color change, itching and rash.  Neurological: Negative for numbness.     Physical Exam Updated Vital Signs BP 139/76 (BP Location: Right Arm)   Pulse 87   Temp 98.6 F (37 C) (Oral)   Resp 16   Ht 6' (1.829 m)   Wt 108.9 kg (240 lb)   SpO2 90%   BMI 32.55 kg/m   Physical Exam  Constitutional: He appears well-developed and well-nourished. No distress.  HENT:  Head: Normocephalic.  Mouth/Throat: Oropharynx is clear and moist.  Neck: Neck supple.  Cardiovascular: Normal rate.   Pulmonary/Chest: Effort normal. No stridor. No respiratory distress. He has no wheezes.  Musculoskeletal: Normal range of motion. He exhibits no edema.  Skin: There is erythema.  8 cm slightly edematous, erythematous macular patch left upper chest wall with central bite site. No drainage, fine tiny clear vesicles at the central portion.  No retained fb.  ED Treatments / Results  Labs (all labs ordered are listed, but only abnormal results are displayed) Labs Reviewed - No data to display  EKG  EKG Interpretation None       Radiology No results found.  Procedures Procedures (including critical care time)  Medications Ordered in ED Medications  cephALEXin (KEFLEX) capsule 500 mg (500 mg Oral Given 08/07/16 2024)  diphenhydrAMINE (BENADRYL) capsule 25 mg (25 mg Oral Given 08/07/16 2024)     Initial Impression / Assessment and Plan / ED Course  I have reviewed the triage vital signs and the nursing  notes.  Pertinent labs & imaging results that were available during my care of the patient were reviewed by me and considered in my medical decision making (see chart for details).     Pt with localized reaction to insect bite, impressive surrounding erythema, probably histamine reaction but will cover for early cellulitis.  Benadryl, cool compresses, keflex. F/u with pcp for any worsened or persistent sx.  No respiratory, face, mouth or throat edema or itching.    Final Clinical Impressions(s) / ED Diagnoses   Final diagnoses:  Insect bite, initial encounter  Cellulitis of chest wall    New Prescriptions Discharge Medication List as of 08/07/2016  8:15 PM    START taking these medications   Details  cephALEXin (KEFLEX) 500 MG capsule Take 1 capsule (500 mg total) by mouth 4 (four) times daily., Starting Tue 08/07/2016, Print    diphenhydrAMINE (BENADRYL) 25 mg capsule Take 1 capsule (25 mg total) by mouth every 6 (six) hours as needed., Starting Tue 08/07/2016, Print         Burgess Amordol, Asriel Westrup, PA-C 08/09/16 1414    Samuel JesterMcManus, Kathleen, DO 08/10/16 [phone]

## 2016-09-10 ENCOUNTER — Ambulatory Visit: Payer: MEDICAID | Admitting: Diagnostic Neuroimaging

## 2016-10-29 ENCOUNTER — Ambulatory Visit: Payer: MEDICAID | Admitting: Diagnostic Neuroimaging

## 2016-10-30 ENCOUNTER — Encounter: Payer: Self-pay | Admitting: Diagnostic Neuroimaging

## 2016-11-08 ENCOUNTER — Ambulatory Visit (INDEPENDENT_AMBULATORY_CARE_PROVIDER_SITE_OTHER): Payer: Self-pay | Admitting: Family Medicine

## 2016-11-08 ENCOUNTER — Encounter: Payer: Self-pay | Admitting: Family Medicine

## 2016-11-08 VITALS — BP 132/72 | HR 86 | Temp 98.4°F | Resp 14 | Ht 74.0 in | Wt 247.0 lb

## 2016-11-08 DIAGNOSIS — F17200 Nicotine dependence, unspecified, uncomplicated: Secondary | ICD-10-CM

## 2016-11-08 DIAGNOSIS — F329 Major depressive disorder, single episode, unspecified: Secondary | ICD-10-CM

## 2016-11-08 DIAGNOSIS — E669 Obesity, unspecified: Secondary | ICD-10-CM

## 2016-11-08 DIAGNOSIS — F419 Anxiety disorder, unspecified: Secondary | ICD-10-CM

## 2016-11-08 LAB — POCT GLYCOSYLATED HEMOGLOBIN (HGB A1C): HEMOGLOBIN A1C: 5.2

## 2016-11-08 MED ORDER — ESCITALOPRAM OXALATE 10 MG PO TABS: 10.0000 mg | ORAL_TABLET | Freq: Every day | ORAL | 5 refills | Status: AC

## 2016-11-08 NOTE — Patient Instructions (Addendum)
[facility] [address] [place], [place] is M-F 8am-3pm    Living With Anxiety After being diagnosed with an anxiety disorder, you may be relieved to know why you have felt or behaved a certain way. It is natural to also feel overwhelmed about the treatment ahead and what it will mean for your life. With care and support, you can manage this condition and recover from it. How to cope with anxiety Dealing with stress Stress is your body's reaction to life changes and events, both good and bad. Stress can last just a few hours or it can be ongoing. Stress can play a major role in anxiety, so it is important to learn both how to cope with stress and how to think about it differently. Talk with your health care provider or a counselor to learn more about stress reduction. He or she may suggest some stress reduction techniques, such as:  Music therapy. This can include creating or listening to music that you enjoy and that inspires you.  Mindfulness-based meditation. This involves being aware of your normal breaths, rather than trying to control your breathing. It can be done while sitting or walking.  Centering prayer. This is a kind of meditation that involves focusing on a word, phrase, or sacred image that is meaningful to you and that brings you peace.  Deep breathing. To do this, expand your stomach and inhale slowly through your nose. Hold your breath for 3-5 seconds. Then exhale slowly, allowing your stomach muscles to relax.  Self-talk. This is a skill where you identify thought patterns that lead to anxiety reactions and correct those thoughts.  Muscle relaxation. This involves tensing muscles then relaxing them.  Choose a stress reduction technique that fits your lifestyle and personality. Stress reduction techniques take time and practice. Set aside 5-15 minutes a day to do them. Therapists can offer training in these techniques. The training  may be covered by some insurance plans. Other things you can do to manage stress include:  Keeping a stress diary. This can help you learn what triggers your stress and ways to control your response.  Thinking about how you respond to certain situations. You may not be able to control everything, but you can control your reaction.  Making time for activities that help you relax, and not feeling guilty about spending your time in this way.  Therapy combined with coping and stress-reduction skills provides the best chance for successful treatment. Medicines Medicines can help ease symptoms. Medicines for anxiety include:  Anti-anxiety drugs.  Antidepressants.  Beta-blockers.  Medicines may be used as the main treatment for anxiety disorder, along with therapy, or if other treatments are not working. Medicines should be prescribed by a health care provider. Relationships Relationships can play a big part in helping you recover. Try to spend more time connecting with trusted friends and family members. Consider going to couples counseling, taking family education classes, or going to family therapy. Therapy can help you and others better understand the condition. How to recognize changes in your condition Everyone has a different response to treatment for anxiety. Recovery from anxiety happens when symptoms decrease and stop interfering with your daily activities at home or work. This may mean that you will start to:  Have better concentration and focus.  Sleep better.  Be less irritable.  Have more energy.  Have improved memory.  It is important to recognize when your condition is getting worse. Contact your health  care provider if your symptoms interfere with home or work and you do not feel like your condition is improving. Where to find help and support: You can get help and support from these sources:  Self-help groups.  Online and [employer].  A trusted  spiritual leader.  Couples counseling.  Family education classes.  Family therapy.  Follow these instructions at home:  Eat a healthy diet that includes plenty of vegetables, fruits, whole grains, low-fat dairy products, and lean protein. Do not eat a lot of foods that are high in solid fats, added sugars, or salt.  Exercise. Most adults should do the following: ? Exercise for at least 150 minutes each week. The exercise should increase your heart rate and make you sweat (moderate-intensity exercise). ? Strengthening exercises at least twice a week.  Cut down on caffeine, tobacco, alcohol, and other potentially harmful substances.  Get the right amount and quality of sleep. Most adults need 7-9 hours of sleep each night.  Make choices that simplify your life.  Take over-the-counter and prescription medicines only as told by your health care provider.  Avoid caffeine, alcohol, and certain over-the-counter cold medicines. These may make you feel worse. Ask your pharmacist which medicines to avoid.  Keep all follow-up visits as told by your health care provider. This is important. Questions to ask your health care provider  Would I benefit from therapy?  How often should I follow up with a health care provider?  How long do I need to take medicine?  Are there any long-term side effects of my medicine?  Are there any alternatives to taking medicine? Contact a health care provider if:  You have a hard time staying focused or finishing daily tasks.  You spend many hours a day feeling worried about everyday life.  You become exhausted by worry.  You start to have headaches, feel tense, or have nausea.  You urinate more than normal.  You have diarrhea. Get help right away if:  You have a racing heart and shortness of breath.  You have thoughts of hurting yourself or others. If you ever feel like you may hurt yourself or others, or have thoughts about taking your own  life, get help right away. You can go to your nearest emergency department or call:  Your local emergency services (911 in the [place]  A suicide crisis helpline, such as the National Suicide Prevention Lifeline at (972) 348-6967. This is open 24-hours a day.  Summary  Taking steps to deal with stress can help calm you.  Medicines cannot cure anxiety disorders, but they can help ease symptoms.  Family, friends, and partners can play a big part in helping you recover from an anxiety disorder. This information is not intended to replace advice given to you by your health care provider. Make sure you discuss any questions you have with your health care provider. Document Released: 12/27/2015 Document Revised: 12/27/2015 Document Reviewed: 12/27/2015 Elsevier Interactive Patient Education  2018 [employer].  Tobacco Use Disorder Tobacco use disorder (TUD) is a mental disorder. It is the long-term use of tobacco in spite of related health problems or difficulty with normal life activities. Tobacco is most commonly smoked as cigarettes and less commonly as cigars or pipes. Smokeless chewing tobacco and snuff are also popular. People with TUD get a feeling of extreme pleasure (euphoria) from using tobacco and have a desire to use it again and again. Repeated use of tobacco can cause problems. The addictive effects of tobacco  are due mainly tothe ingredient nicotine. Nicotine also causes a rush of adrenaline (epinephrine) in the body. This leads to increased blood pressure, heart rate, and breathing rate. These changes may cause problems for people with high blood pressure, weak hearts, or lung disease. High doses of nicotine in children and pets can lead to seizures and death. Tobacco contains a number of other unsafe chemicals. These chemicals are especially harmful when inhaled as smoke and can damage almost every organ in the body. Smokers live shorter lives than nonsmokers and are at risk of dying  from a number of diseases and cancers. Tobacco smoke can also cause health problems for nonsmokers (due to inhaling secondhand smoke). Smoking is also a fire hazard. TUD usually starts in the late teenage years and is most common in young adults between the ages of 63 and 25 years. People who start smoking earlier in life are more likely to continue smoking as adults. TUD is somewhat more common in men than women. People with TUD are at higher risk for using alcohol and other drugs of abuse. What increases the risk? Risk factors for TUD include:  Having family members with the disorder.  Being around people who use tobacco.  Having an existing mental health issue such as schizophrenia, depression, bipolar disorder, ADHD, or posttraumatic stress disorder (PTSD).  What are the signs or symptoms? People with tobacco use disorder have two or more of the following signs and symptoms within 12 months:  Use of more tobacco over a longer period than intended.  Not able to cut down or control tobacco use.  A lot of time spent obtaining or using tobacco.  Strong desire or urge to use tobacco (craving). Cravings may last for 6 months or longer after quitting.  Use of tobacco even when use leads to major problems at work, school, or home.  Use of tobacco even when use leads to relationship problems.  Giving up or cutting down on important life activities because of tobacco use.  Repeatedly using tobacco in situations where it puts you or others in physical danger, like smoking in bed.  Use of tobacco even when it is known that a physical or mental problem is likely related to tobacco use. ? Physical problems are numerous and may include chronic bronchitis, emphysema, lung and other cancers, gum disease, high blood pressure, heart disease, and stroke. ? Mental problems caused by tobacco may include difficulty sleeping and anxiety.  Need to use greater amounts of tobacco to get the same effect.  This means you have developed a tolerance.  Withdrawal symptoms as a result of stopping or rapidly cutting back use. These symptoms may last a month or more after quitting and include the following: ? Depressed, anxious, or irritable mood. ? Difficulty concentrating. ? Increased appetite. ? Restlessness or trouble sleeping. ? Use of tobacco to avoid withdrawal symptoms.  How is this diagnosed? Tobacco use disorder is diagnosed by your health care provider. A diagnosis may be made by:  Your health care provider asking questions about your tobacco use and any problems it may be causing.  A physical exam.  Lab tests.  You may be referred to a mental health professional or addiction specialist.  The severity of tobacco use disorder depends on the number of signs and symptoms you have:  Mild-Two or three symptoms.  Moderate-Four or five symptoms.  Severe-Six or more symptoms.  How is this treated? Many people with tobacco use disorder are unable to quit on  their own and need help. Treatment options include the following:  Nicotine replacement therapy (NRT). NRT provides nicotine without the other harmful chemicals in tobacco. NRT gradually lowers the dosage of nicotine in the body and reduces withdrawal symptoms. NRT is available in over-the-counter forms (gum, lozenges, and skin patches) as well as prescription forms (mouth inhaler and nasal spray).  Medicines.This may include: ? Antidepressant medicine that may reduce nicotine cravings. ? A medicine that acts on nicotine receptors in the brain to reduce cravings and withdrawal symptoms. It may also block the effects of tobacco in people with TUD who relapse.  Counseling or talk therapy. A form of talk therapy called behavioral therapy is commonly used to treat people with TUD. Behavioral therapy looks at triggers for tobacco use, how to avoid them, and how to cope with cravings. It is most effective in person or by phone but is  also available in self-help forms (books and Internet websites).  Support groups. These provide emotional support, advice, and guidance for quitting tobacco.  The most effective treatment for TUD is usually a combination of medicine, talk therapy, and support groups. Follow these instructions at home:  Keep all follow-up visits as directed by your health care provider. This is important.  Take medicines only as directed by your health care provider.  Check with your health care provider before starting new prescription or over-the-counter medicines. Contact a health care provider if:  You are not able to take your medicines as prescribed.  Treatment is not helping your TUD and your symptoms get worse. Get help right away if:  You have serious thoughts about hurting yourself or others.  You have trouble breathing, chest pain, sudden weakness, or sudden numbness in part of your body. This information is not intended to replace advice given to you by your health care provider. Make sure you discuss any questions you have with your health care provider. Document Released: 09/07/2003 Document Revised: 09/04/2015 Document Reviewed: 02/27/2013 Elsevier Interactive Patient Education  [employer].

## 2016-11-08 NOTE — Progress Notes (Signed)
Chief Complaint  Patient presents with  . Depression  . Anxiety     Ronald Gray, a 21 year old male with a history of anxiety and depression presents for a 6 month follow up. Patient has been taking Lexipro 10 mg consistently.   He has the following anxiety symptoms: difficulty concentrating, racing thoughts.   Symptoms have been fairly controlled.  He denies current suicidal and homicidal ideation. Family history significant for anxiety and depression.  Past Medical History:  Diagnosis Date  . Depression    Social History   Social History  . Marital status: Single    Spouse name: N/A  . Number of children: N/A  . Years of education: N/A   Occupational History  . Not on file.   Social History Main Topics  . Smoking status: Current Every Day Smoker    Packs/day: 0.25    Types: Cigarettes  . Smokeless tobacco: Never Used  . Alcohol use Yes     Comment: occ  . Drug use: Yes    Types: Marijuana     Comment: pt came in under a combative state from unknown substance  . Sexual activity: Not on file   Other Topics Concern  . Not on file   Social History Narrative  . No narrative on file  Review of Systems  Constitutional: Negative.   HENT: Negative.   Eyes: Negative.   Respiratory: Negative.   Cardiovascular: Negative.   Gastrointestinal: Negative.   Genitourinary: Negative.   Musculoskeletal: Negative.   Skin: Negative.   Neurological: Negative.   Endo/Heme/Allergies: Negative.   Psychiatric/Behavioral: Positive for depression. Negative for hallucinations, memory loss and substance abuse. The patient is nervous/anxious. The patient does not have insomnia.   Physical Exam  Constitutional: He is oriented to person, place, and time and well-developed, well-nourished, and in no distress.  Cardiovascular: Normal rate, normal heart sounds and intact distal pulses.   Pulmonary/Chest: Effort normal and breath sounds normal.  Abdominal: Soft. Bowel sounds are  normal.  Musculoskeletal: Normal range of motion.  Neurological: He is alert and oriented to person, place, and time. Gait normal. GCS score is 15.  Skin: Skin is dry.  Psychiatric: Mood, memory and judgment normal. His affect is not blunt, not labile and not inappropriate. He does not express impulsivity. He expresses no homicidal and no suicidal ideation. He is not apathetic and not concerned with wish fulfillment. He exhibits ordered thought content. He does not have a flat affect.    BP 132/72 (BP Location: Left Arm, Patient Position: Sitting, Cuff Size: Large)   Pulse 86   Temp 98.4 F (36.9 C) (Oral)   Resp 14   Ht 6\' 2"  (1.88 m)   Wt 247 lb (112 kg)   SpO2 98%   BMI 31.71 kg/m   1. Anxiety and depression GAD 7 : Generalized Anxiety Score 11/08/2016  Nervous, Anxious, on Edge 1  Control/stop worrying 0  Worry too much - different things 0  Trouble relaxing 0  Restless 2  Easily annoyed or irritable 1  Afraid - awful might happen 2  Total GAD 7 Score 6  Anxiety Difficulty Not difficult at all   Depression screen [facility] 2/9 11/08/2016 07/09/2016 06/15/2016  Decreased Interest 0 0 0  Down, Depressed, Hopeless 0 0 0  PHQ - 2 Score 0 0 0  Altered sleeping 0 - -  Feeling bad or failure about yourself  0 - -  Trouble concentrating 0 - -  Moving slowly or  fidgety/restless 0 - -  Suicidal thoughts 0 - -   Symptoms are controlled on Lexapro, will continue 10 mg at bedtime.  [facility] - escitalopram (LEXAPRO) 10 MG tablet; Take 1 tablet (10 mg total) by mouth at bedtime.  Dispense: 30 tablet; Refill: 5  2. Obesity (BMI 30-39.9) Body mass index is 31.71 kg/m.  Recommend a lowfat, low carbohydrate diet divided over 5-6 small meals, increase water intake to 6-8 glasses, and 150 minutes per week of cardiovascular exercise.    - HgB A1c  3. Tobacco dependence Smoking cessation instruction/counseling given:  counseled patient  on the dangers of tobacco use, advised patient to stop smoking, and reviewed strategies to maximize success    RTC: 6 months with Joaquin CourtsKimberly Harris, FNP  Nolon NationsLaChina Moore Hollis  MSN, FNP-C Patient Care [facility] [address]  [place], [place] [address] [phone]

## 2017-05-10 ENCOUNTER — Ambulatory Visit: Payer: Self-pay | Admitting: Family Medicine

## 2018-08-23 IMAGING — CR DG CHEST 1V PORT
1 series · 1 of 1 positions shown · non-contrast
Comparison: Chest radiograph performed 04/15/2016

CLINICAL DATA: Patient became unresponsive. Altered mental status.
Initial encounter.

EXAM:
PORTABLE CHEST 1 VIEW

[portable]
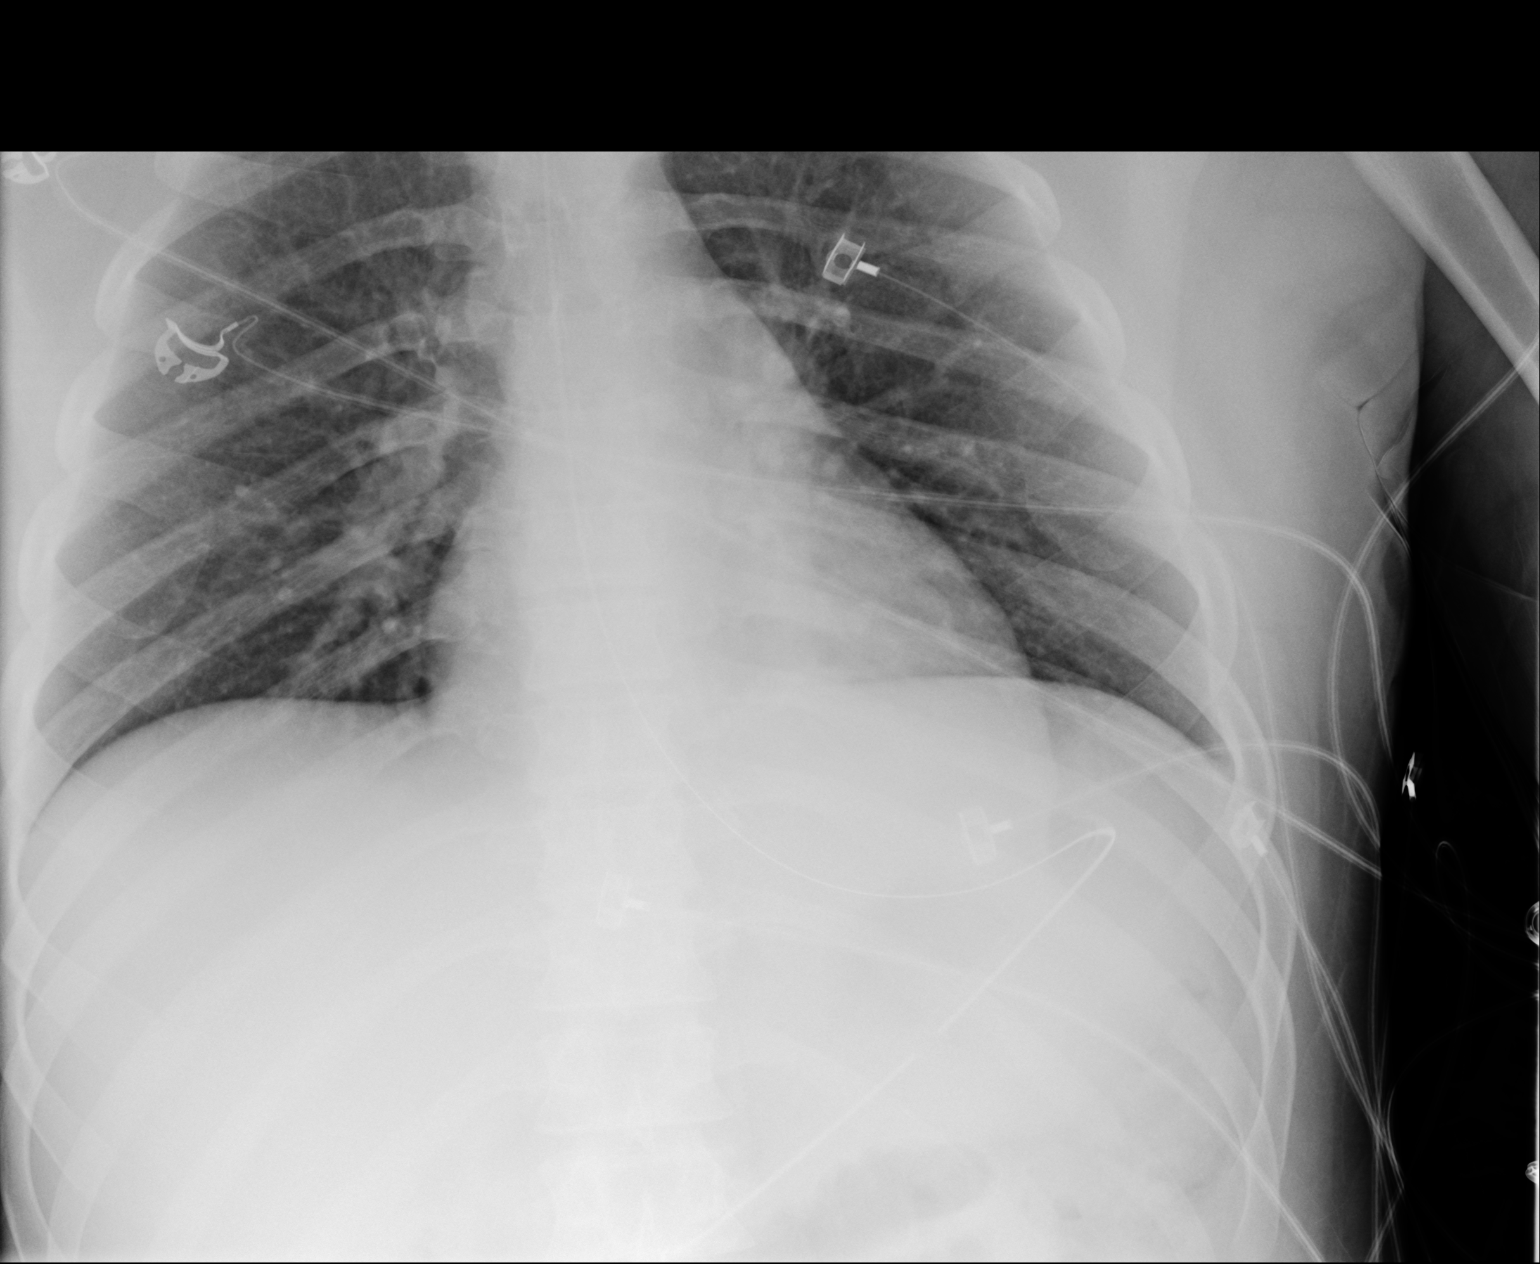

[1 of 1 positions shown; findings below may reference images not displayed]

FINDINGS: The lungs are well-aerated. Mild retrocardiac opacity may reflect
atelectasis or possibly mild infection. There is no evidence of
pleural effusion or pneumothorax.

The cardiomediastinal silhouette is within normal limits. No acute
osseous abnormalities are seen. An enteric tube is noted extending
below the diaphragm.
IMPRESSION: Mild retrocardiac opacity may reflect atelectasis or possibly mild
infection. Would correlate with the patient's symptoms.

## 2022-12-28 ENCOUNTER — Emergency Department (HOSPITAL_COMMUNITY)
Admission: EM | Admit: 2022-12-28 | Discharge: 2022-12-28 | Disposition: A | Discharge status: Home / Self Care | Payer: Self-pay | Attending: Emergency Medicine | Admitting: Emergency Medicine

## 2022-12-28 ENCOUNTER — Emergency Department (HOSPITAL_COMMUNITY): Payer: Self-pay

## 2022-12-28 ENCOUNTER — Other Ambulatory Visit: Payer: Self-pay

## 2022-12-28 DIAGNOSIS — S61452A Open bite of left hand, initial encounter: Secondary | ICD-10-CM | POA: Insufficient documentation

## 2022-12-28 DIAGNOSIS — W540XXA Bitten by dog, initial encounter: Secondary | ICD-10-CM | POA: Diagnosis not present

## 2022-12-28 DIAGNOSIS — Z23 Encounter for immunization: Secondary | ICD-10-CM | POA: Insufficient documentation

## 2022-12-28 DIAGNOSIS — Z91040 Latex allergy status: Secondary | ICD-10-CM | POA: Diagnosis not present

## 2022-12-28 MED ORDER — TETANUS-DIPHTH-ACELL PERTUSSIS 5-2.5-18.5 LF-MCG/0.5 IM SUSY
0.5000 mL | PREFILLED_SYRINGE | Freq: Once | INTRAMUSCULAR | Status: AC
  Administered 2022-12-28: 0.5 mL via INTRAMUSCULAR
  Filled 2022-12-28: qty 0.5

## 2022-12-28 MED ORDER — AMOXICILLIN-POT CLAVULANATE 875-125 MG PO TABS
1.0000 | ORAL_TABLET | Freq: Once | ORAL | Status: AC
  Administered 2022-12-28: 1 via ORAL
  Filled 2022-12-28: qty 1

## 2022-12-28 MED ORDER — DOUBLE ANTIBIOTIC 500-10000 UNIT/GM EX OINT
TOPICAL_OINTMENT | Freq: Once | CUTANEOUS | Status: AC
  Administered 2022-12-28: 1 via TOPICAL
  Filled 2022-12-28: qty 1

## 2022-12-28 MED ORDER — HYDROCODONE-ACETAMINOPHEN 5-325 MG PO TABS
1.0000 | ORAL_TABLET | Freq: Once | ORAL | Status: AC
  Administered 2022-12-28: 1 via ORAL
  Filled 2022-12-28: qty 1

## 2022-12-28 MED ORDER — AMOXICILLIN-POT CLAVULANATE 875-125 MG PO TABS: 1.0000 | ORAL_TABLET | Freq: Two times a day (BID) | ORAL | 0 refills | Status: AC

## 2022-12-28 MED ORDER — IBUPROFEN 400 MG PO TABS
600.0000 mg | ORAL_TABLET | Freq: Once | ORAL | Status: AC
  Administered 2022-12-28: 600 mg via ORAL
  Filled 2022-12-28: qty 2

## 2022-12-28 MED ORDER — IBUPROFEN 600 MG PO TABS: 600.0000 mg | ORAL_TABLET | Freq: Four times a day (QID) | ORAL | 0 refills | Status: AC | PRN

## 2022-12-28 NOTE — ED Triage Notes (Signed)
Pt attempted to help a owner retrieve his dog and the dog bit his left hand while attempting to capture the dog. Owner of dog told pt the dog has had all his shots. This happened at [facility] course.

## 2022-12-28 NOTE — ED Provider Notes (Signed)
EMERGENCY DEPARTMENT AT [facility] Provider Note   CSN: [id number] Arrival date & time: 12/28/22  4098     History  Chief Complaint  Patient presents with   Animal Bite    SOUL COMPANION is a 27 y.o. male.  He has history of depression but otherwise no significant past medical history.  Presents to the ER for dog bite to the left hand.  He states that he was working and helping a home owner retrieve his dog that was loose and he had the dog by the collar, the dog turned and bit his left hand.  Dog is reportedly up to date on its rabies vaccine.  Patient denies numbness or tingling but reports pain and swelling to the left hand.   Animal Bite      Home Medications Prior to Admission medications   Medication Sig Start Date End Date Taking? Authorizing Provider  amoxicillin-clavulanate (AUGMENTIN) 875-125 MG tablet Take 1 tablet by mouth every 12 (twelve) hours for 5 days. 12/28/22 01/02/23 Yes Jaymon Dudek A, PA-C  ibuprofen (ADVIL) 600 MG tablet Take 1 tablet (600 mg total) by mouth every 6 (six) hours as needed. 12/28/22  Yes Johnthomas Lader A, PA-C  escitalopram (LEXAPRO) 10 MG tablet Take 1 tablet (10 mg total) by mouth at bedtime. 11/08/16   Massie Maroon, FNP  traZODone (DESYREL) 50 MG tablet Take 1 tablet (50 mg total) by mouth at bedtime as needed for sleep. 06/15/16   Bing Neighbors, NP      Allergies    Bee venom and Latex    Review of Systems   Review of Systems  Physical Exam Updated Vital Signs BP (!) 136/97   Pulse 93   Temp 98.2 F (36.8 C) (Oral)   Resp 17   Ht 5\' 11"  (1.803 m)   Wt 112.5 kg   SpO2 99%   BMI 34.59 kg/m  Physical Exam Vitals and nursing note reviewed.  Constitutional:      General: He is not in acute distress.    Appearance: He is well-developed.  HENT:     Head: Normocephalic and atraumatic.  Eyes:     Conjunctiva/sclera: Conjunctivae normal.  Cardiovascular:     Rate and Rhythm:  Normal rate and regular rhythm.     Heart sounds: No murmur heard. Pulmonary:     Effort: Pulmonary effort is normal. No respiratory distress.     Breath sounds: Normal breath sounds.  Abdominal:     Palpations: Abdomen is soft.  Musculoskeletal:        General: No swelling.     Cervical back: Neck supple.     Comments: Soft tissue swelling to the dorsum of the left hand, multiple small punctures noted to the dorsum of the left hand, the deepest being over the webspace between the thumb and first finger.  There is no active bleeding.  Patient can make a fist, thumbs up and okay sign and cross the fingers and has intact sensation to light touch throughout his entire left hand.  Skin:    General: Skin is warm and dry.     Capillary Refill: Capillary refill takes less than 2 seconds.  Neurological:     General: No focal deficit present.     Mental Status: He is alert.  Psychiatric:        Mood and Affect: Mood normal.     ED Results / Procedures / Treatments   Labs (all labs ordered  are listed, but only abnormal results are displayed) Labs Reviewed - No data to display  EKG None  Radiology DG Hand Complete Left Result Date: 12/28/2022 CLINICAL DATA:  Dog bite. EXAM: LEFT HAND - COMPLETE 3+ VIEW COMPARISON:  None Available. FINDINGS: No acute fracture or dislocation. No aggressive osseous lesion. No arthritis. No radiopaque foreign bodies. No focal soft tissue swelling or soft tissue defect. No evidence of air within the soft tissue. Soft tissues are within normal limits. IMPRESSION: *No acute osseous abnormality of the left hand. *No focal soft tissue swelling, soft tissue defect or air within the soft tissue. No radiopaque foreign bodies. Electronically Signed   By: Jules Schick M.D.   On: 12/28/2022 11:05    Procedures Procedures    Medications Ordered in ED Medications  polymixin-bacitracin (POLYSPORIN) ointment (has no administration in time range)  ibuprofen (ADVIL)  tablet 600 mg (600 mg Oral Given 12/28/22 1020)  HYDROcodone-acetaminophen (NORCO/VICODIN) 5-325 MG per tablet 1 tablet (1 tablet Oral Given 12/28/22 1019)  amoxicillin-clavulanate (AUGMENTIN) 875-125 MG per tablet 1 tablet (1 tablet Oral Given 12/28/22 1020)  Tdap (BOOSTRIX) injection 0.5 mL (0.5 mLs Intramuscular Given 12/28/22 1014)    ED Course/ Medical Decision Making/ A&P                                 Medical Decision Making DDX: Dog bite, cellulitis, puncture wound, fracture, foreign body, other  ED course: Patient presents the ER for a dog bite to his left hand.  Had a shot updated today, dog is up-to-date on rabies vaccine so no indication for rabies prophylaxis.  No neurovascular compromise of the left hand on exam.  Wound was copiously irrigated with tap water.  Pain controlled with ibuprofen and Norco to facilitate thorough cleaning.  Given multiple punctures to the hand this is fairly high risk for infection so will prescribe Augmentin for infection prophylaxis.  X-ray is pending to rule out foreign body or fracture.  X-ray viewed and interpreted by me, no fracture or dislocation, no foreign body appreciated, agree with radiology read  Patient was given ibuprofen and hydrocodone help with pain and had improvement of symptoms.  Amount and/or Complexity of Data Reviewed Radiology: ordered.  Risk OTC drugs. Prescription drug management.           Final Clinical Impression(s) / ED Diagnoses Final diagnoses:  Dog bite of left hand, initial encounter    Rx / DC Orders ED Discharge Orders          Ordered    amoxicillin-clavulanate (AUGMENTIN) 875-125 MG tablet  Every 12 hours        12/28/22 1115    ibuprofen (ADVIL) 600 MG tablet  Every 6 hours PRN        12/28/22 1115              Ma Rings, PA-C 12/28/22 1118    Pricilla Loveless, MD 01/02/23 1620

## 2022-12-28 NOTE — Discharge Instructions (Addendum)
Was a pleasure taking care of you today.  You were evaluated in the emergency department for a dog bite to your left hand.  Your x-ray was normal.  We updated your tetanus shot.  We are starting antibiotics to prevent infection.  Keep the area clean and dry and limit the use of your left hand.  If you develop redness, fever, wound drainage, severe pain, numbness or tingling or other worrisome changes come back to the ER right away.  Otherwise follow-up primary care or urgent care in a couple of days for a wound check.

## 2022-12-28 NOTE — ED Notes (Addendum)
Animal control notified. Area cleaned and blood under control.
